# Patient Record
Sex: Male | Born: 1990
Health system: Southern US, Community
[De-identification: ages and names within clinical notes are randomized; demographics above are authoritative.]

## PROBLEM LIST (undated history)

## (undated) HISTORY — PX: OTHER SURGICAL HISTORY: SHX169

---

## 2020-04-22 ENCOUNTER — Inpatient Hospital Stay (HOSPITAL_COMMUNITY): Payer: Worker's Compensation

## 2020-04-22 ENCOUNTER — Inpatient Hospital Stay (HOSPITAL_COMMUNITY): Payer: Worker's Compensation | Admitting: Anesthesiology

## 2020-04-22 ENCOUNTER — Encounter (HOSPITAL_COMMUNITY)
Admission: EM | Disposition: A | Discharge status: Home / Self Care | Payer: Self-pay | Source: Home / Self Care | Attending: Orthopedic Surgery

## 2020-04-22 ENCOUNTER — Emergency Department (HOSPITAL_COMMUNITY): Payer: Worker's Compensation

## 2020-04-22 ENCOUNTER — Encounter (HOSPITAL_COMMUNITY): Payer: Self-pay

## 2020-04-22 ENCOUNTER — Inpatient Hospital Stay (HOSPITAL_COMMUNITY)
Admission: EM | Admit: 2020-04-22 | Discharge: 2020-04-29 | DRG: 493 | Disposition: A | Discharge status: Home / Self Care | Payer: Worker's Compensation | Attending: Orthopedic Surgery | Admitting: Orthopedic Surgery

## 2020-04-22 DIAGNOSIS — Z419 Encounter for procedure for purposes other than remedying health state, unspecified: Secondary | ICD-10-CM

## 2020-04-22 DIAGNOSIS — E559 Vitamin D deficiency, unspecified: Secondary | ICD-10-CM | POA: Diagnosis present

## 2020-04-22 DIAGNOSIS — Y990 Civilian activity done for income or pay: Secondary | ICD-10-CM

## 2020-04-22 DIAGNOSIS — S83412A Sprain of medial collateral ligament of left knee, initial encounter: Secondary | ICD-10-CM | POA: Diagnosis present

## 2020-04-22 DIAGNOSIS — R030 Elevated blood-pressure reading, without diagnosis of hypertension: Secondary | ICD-10-CM | POA: Diagnosis present

## 2020-04-22 DIAGNOSIS — R58 Hemorrhage, not elsewhere classified: Secondary | ICD-10-CM

## 2020-04-22 DIAGNOSIS — S83429A Sprain of lateral collateral ligament of unspecified knee, initial encounter: Secondary | ICD-10-CM | POA: Diagnosis present

## 2020-04-22 DIAGNOSIS — S82141A Displaced bicondylar fracture of right tibia, initial encounter for closed fracture: Secondary | ICD-10-CM | POA: Diagnosis present

## 2020-04-22 DIAGNOSIS — W132XXA Fall from, out of or through roof, initial encounter: Secondary | ICD-10-CM | POA: Diagnosis present

## 2020-04-22 DIAGNOSIS — S83282A Other tear of lateral meniscus, current injury, left knee, initial encounter: Secondary | ICD-10-CM | POA: Diagnosis present

## 2020-04-22 DIAGNOSIS — T148XXA Other injury of unspecified body region, initial encounter: Secondary | ICD-10-CM

## 2020-04-22 DIAGNOSIS — S82142A Displaced bicondylar fracture of left tibia, initial encounter for closed fracture: Secondary | ICD-10-CM | POA: Diagnosis present

## 2020-04-22 DIAGNOSIS — Z20822 Contact with and (suspected) exposure to covid-19: Secondary | ICD-10-CM | POA: Diagnosis present

## 2020-04-22 DIAGNOSIS — D62 Acute posthemorrhagic anemia: Secondary | ICD-10-CM | POA: Diagnosis not present

## 2020-04-22 DIAGNOSIS — S83512A Sprain of anterior cruciate ligament of left knee, initial encounter: Secondary | ICD-10-CM | POA: Diagnosis present

## 2020-04-22 DIAGNOSIS — W139XXA Fall from, out of or through building, not otherwise specified, initial encounter: Secondary | ICD-10-CM

## 2020-04-22 DIAGNOSIS — E8889 Other specified metabolic disorders: Secondary | ICD-10-CM | POA: Diagnosis present

## 2020-04-22 DIAGNOSIS — S8992XA Unspecified injury of left lower leg, initial encounter: Secondary | ICD-10-CM | POA: Diagnosis present

## 2020-04-22 HISTORY — PX: EXTERNAL FIXATION LEG: SHX1549

## 2020-04-22 LAB — RESP PANEL BY RT-PCR (FLU A&B, COVID) ARPGX2
Influenza A by PCR: NEGATIVE
Influenza B by PCR: NEGATIVE
SARS Coronavirus 2 by RT PCR: NEGATIVE

## 2020-04-22 SURGERY — EXTERNAL FIXATION, LOWER EXTREMITY
Anesthesia: General | Laterality: Right

## 2020-04-22 MED ORDER — ONDANSETRON HCL 4 MG/2ML IJ SOLN: 4.0000 mg | Freq: Four times a day (QID) | INTRAMUSCULAR | Status: DC | PRN

## 2020-04-22 MED ORDER — DEXAMETHASONE SODIUM PHOSPHATE 10 MG/ML IJ SOLN
INTRAMUSCULAR | Status: DC | PRN
  Administered 2020-04-22: 10 mg via INTRAVENOUS

## 2020-04-22 MED ORDER — OXYCODONE HCL 5 MG PO TABS
5.0000 mg | ORAL_TABLET | ORAL | Status: DC | PRN
Start: 2020-04-22 — End: 2020-04-29
  Administered 2020-04-22: 5 mg via ORAL
  Administered 2020-04-23 – 2020-04-25 (×6): 10 mg via ORAL
  Administered 2020-04-26: 5 mg via ORAL
  Administered 2020-04-26: 10 mg via ORAL
  Administered 2020-04-27: 5 mg via ORAL
  Administered 2020-04-27: 10 mg via ORAL
  Filled 2020-04-22 (×12): qty 2

## 2020-04-22 MED ORDER — GABAPENTIN 100 MG PO CAPS
100.0000 mg | ORAL_CAPSULE | Freq: Three times a day (TID) | ORAL | Status: DC
  Administered 2020-04-22 – 2020-04-25 (×10): 100 mg via ORAL
  Filled 2020-04-22 (×10): qty 1

## 2020-04-22 MED ORDER — ENOXAPARIN SODIUM 40 MG/0.4ML ~~LOC~~ SOLN
40.0000 mg | SUBCUTANEOUS | Status: DC
  Administered 2020-04-23 – 2020-04-25 (×3): 40 mg via SUBCUTANEOUS
  Filled 2020-04-22 (×3): qty 0.4

## 2020-04-22 MED ORDER — DEXTROSE 5 % IV SOLN
3.0000 g | INTRAVENOUS | Status: DC
  Filled 2020-04-22: qty 3000

## 2020-04-22 MED ORDER — METOCLOPRAMIDE HCL 5 MG PO TABS: 5.0000 mg | ORAL_TABLET | Freq: Three times a day (TID) | ORAL | Status: DC | PRN

## 2020-04-22 MED ORDER — FENTANYL CITRATE (PF) 100 MCG/2ML IJ SOLN
25.0000 ug | INTRAMUSCULAR | Status: DC | PRN
  Administered 2020-04-22: 50 ug via INTRAVENOUS

## 2020-04-22 MED ORDER — LIDOCAINE 2% (20 MG/ML) 5 ML SYRINGE
INTRAMUSCULAR | Status: DC | PRN
  Administered 2020-04-22: 60 mg via INTRAVENOUS

## 2020-04-22 MED ORDER — MORPHINE SULFATE (PF) 2 MG/ML IV SOLN: 2.0000 mg | INTRAVENOUS | Status: DC | PRN

## 2020-04-22 MED ORDER — POVIDONE-IODINE 10 % EX SWAB: 2.0000 "application " | Freq: Once | CUTANEOUS | Status: DC

## 2020-04-22 MED ORDER — LACTATED RINGERS IV SOLN: INTRAVENOUS | Status: DC | PRN

## 2020-04-22 MED ORDER — GLYCOPYRROLATE PF 0.2 MG/ML IJ SOSY
PREFILLED_SYRINGE | INTRAMUSCULAR | Status: DC | PRN
  Administered 2020-04-22: .2 mg via INTRAVENOUS

## 2020-04-22 MED ORDER — MIDAZOLAM HCL 5 MG/5ML IJ SOLN
INTRAMUSCULAR | Status: DC | PRN
  Administered 2020-04-22: 2 mg via INTRAVENOUS

## 2020-04-22 MED ORDER — IBUPROFEN 400 MG PO TABS
600.0000 mg | ORAL_TABLET | Freq: Once | ORAL | Status: AC
  Administered 2020-04-22: 600 mg via ORAL
  Filled 2020-04-22: qty 1

## 2020-04-22 MED ORDER — CHLORHEXIDINE GLUCONATE 4 % EX LIQD: 60.0000 mL | Freq: Once | CUTANEOUS | Status: DC

## 2020-04-22 MED ORDER — OXYCODONE HCL 5 MG PO TABS: 5.0000 mg | ORAL_TABLET | ORAL | Status: DC | PRN

## 2020-04-22 MED ORDER — POLYETHYLENE GLYCOL 3350 17 G PO PACK: 17.0000 g | PACK | Freq: Every day | ORAL | Status: DC | PRN

## 2020-04-22 MED ORDER — CHLORHEXIDINE GLUCONATE 4 % EX LIQD
60.0000 mL | Freq: Once | CUTANEOUS | Status: DC
  Filled 2020-04-22: qty 60

## 2020-04-22 MED ORDER — ONDANSETRON HCL 4 MG PO TABS: 4.0000 mg | ORAL_TABLET | Freq: Four times a day (QID) | ORAL | Status: DC | PRN

## 2020-04-22 MED ORDER — ONDANSETRON HCL 4 MG/2ML IJ SOLN
INTRAMUSCULAR | Status: DC | PRN
  Administered 2020-04-22: 4 mg via INTRAVENOUS

## 2020-04-22 MED ORDER — PROPOFOL 10 MG/ML IV BOLUS
INTRAVENOUS | Status: DC | PRN
  Administered 2020-04-22: 200 mg via INTRAVENOUS
  Administered 2020-04-22: 100 mg via INTRAVENOUS

## 2020-04-22 MED ORDER — DIPHENHYDRAMINE HCL 12.5 MG/5ML PO ELIX: 12.5000 mg | ORAL_SOLUTION | ORAL | Status: DC | PRN

## 2020-04-22 MED ORDER — METHOCARBAMOL 500 MG PO TABS: 500.0000 mg | ORAL_TABLET | Freq: Four times a day (QID) | ORAL | Status: DC | PRN

## 2020-04-22 MED ORDER — METHOCARBAMOL 1000 MG/10ML IJ SOLN
500.0000 mg | Freq: Four times a day (QID) | INTRAVENOUS | Status: DC | PRN
  Filled 2020-04-22: qty 5

## 2020-04-22 MED ORDER — DOCUSATE SODIUM 100 MG PO CAPS
100.0000 mg | ORAL_CAPSULE | Freq: Two times a day (BID) | ORAL | Status: DC
  Administered 2020-04-22 – 2020-04-29 (×13): 100 mg via ORAL
  Filled 2020-04-22 (×13): qty 1

## 2020-04-22 MED ORDER — DEXMEDETOMIDINE HCL 200 MCG/2ML IV SOLN
INTRAVENOUS | Status: DC | PRN
  Administered 2020-04-22: 12 ug via INTRAVENOUS

## 2020-04-22 MED ORDER — PANTOPRAZOLE SODIUM 40 MG PO TBEC
40.0000 mg | DELAYED_RELEASE_TABLET | Freq: Every day | ORAL | Status: DC
  Administered 2020-04-22 – 2020-04-29 (×7): 40 mg via ORAL
  Filled 2020-04-22 (×7): qty 1

## 2020-04-22 MED ORDER — ACETAMINOPHEN 500 MG PO TABS: 1000.0000 mg | ORAL_TABLET | Freq: Once | ORAL | Status: DC

## 2020-04-22 MED ORDER — MIDAZOLAM HCL 2 MG/2ML IJ SOLN
INTRAMUSCULAR | Status: AC
  Filled 2020-04-22: qty 2

## 2020-04-22 MED ORDER — CELECOXIB 200 MG PO CAPS
ORAL_CAPSULE | ORAL | Status: AC
  Filled 2020-04-22: qty 1

## 2020-04-22 MED ORDER — ENOXAPARIN SODIUM 40 MG/0.4ML ~~LOC~~ SOLN: 40.0000 mg | SUBCUTANEOUS | Status: DC

## 2020-04-22 MED ORDER — SODIUM CHLORIDE 0.9 % IV SOLN: INTRAVENOUS | Status: DC

## 2020-04-22 MED ORDER — CELECOXIB 200 MG PO CAPS: 200.0000 mg | ORAL_CAPSULE | Freq: Once | ORAL | Status: DC

## 2020-04-22 MED ORDER — PHENYLEPHRINE HCL (PRESSORS) 10 MG/ML IV SOLN
INTRAVENOUS | Status: DC | PRN
  Administered 2020-04-22: 100 ug via INTRAVENOUS

## 2020-04-22 MED ORDER — METOCLOPRAMIDE HCL 5 MG/ML IJ SOLN: 5.0000 mg | Freq: Three times a day (TID) | INTRAMUSCULAR | Status: DC | PRN

## 2020-04-22 MED ORDER — FENTANYL CITRATE (PF) 250 MCG/5ML IJ SOLN
INTRAMUSCULAR | Status: AC
  Filled 2020-04-22: qty 5

## 2020-04-22 MED ORDER — AMISULPRIDE (ANTIEMETIC) 5 MG/2ML IV SOLN: 10.0000 mg | Freq: Once | INTRAVENOUS | Status: DC | PRN

## 2020-04-22 MED ORDER — FENTANYL CITRATE (PF) 250 MCG/5ML IJ SOLN
INTRAMUSCULAR | Status: DC | PRN
  Administered 2020-04-22 (×3): 50 ug via INTRAVENOUS
  Administered 2020-04-22: 100 ug via INTRAVENOUS

## 2020-04-22 MED ORDER — DEXTROSE 5 % IV SOLN
3.0000 g | INTRAVENOUS | Status: AC
  Administered 2020-04-22: 3 g via INTRAVENOUS
  Filled 2020-04-22 (×3): qty 3000

## 2020-04-22 MED ORDER — ACETAMINOPHEN 325 MG PO TABS
325.0000 mg | ORAL_TABLET | Freq: Four times a day (QID) | ORAL | Status: DC | PRN
Start: 2020-04-23 — End: 2020-04-26
  Administered 2020-04-24: 650 mg via ORAL
  Filled 2020-04-22: qty 2

## 2020-04-22 MED ORDER — HYDROMORPHONE HCL 1 MG/ML IJ SOLN
0.5000 mg | INTRAMUSCULAR | Status: DC | PRN
  Administered 2020-04-25 – 2020-04-26 (×3): 1 mg via INTRAVENOUS
  Filled 2020-04-22 (×3): qty 1

## 2020-04-22 MED ORDER — SUCCINYLCHOLINE CHLORIDE 20 MG/ML IJ SOLN
INTRAMUSCULAR | Status: DC | PRN
  Administered 2020-04-22: 120 mg via INTRAVENOUS

## 2020-04-22 MED ORDER — METHOCARBAMOL 500 MG PO TABS
ORAL_TABLET | ORAL | Status: AC
  Administered 2020-04-22: 500 mg via ORAL
  Filled 2020-04-22: qty 1

## 2020-04-22 MED ORDER — ACETAMINOPHEN 500 MG PO TABS
ORAL_TABLET | ORAL | Status: AC
  Filled 2020-04-22: qty 2

## 2020-04-22 MED ORDER — OXYCODONE HCL 5 MG PO TABS
10.0000 mg | ORAL_TABLET | ORAL | Status: DC | PRN
  Administered 2020-04-23 – 2020-04-25 (×3): 10 mg via ORAL
  Administered 2020-04-25: 15 mg via ORAL
  Administered 2020-04-26: 10 mg via ORAL
  Administered 2020-04-27 – 2020-04-29 (×10): 15 mg via ORAL
  Filled 2020-04-22 (×6): qty 3
  Filled 2020-04-22: qty 2
  Filled 2020-04-22 (×3): qty 3
  Filled 2020-04-22: qty 2
  Filled 2020-04-22 (×2): qty 3
  Filled 2020-04-22: qty 2

## 2020-04-22 MED ORDER — ACETAMINOPHEN 10 MG/ML IV SOLN
INTRAVENOUS | Status: DC | PRN
  Administered 2020-04-22: 1000 mg via INTRAVENOUS

## 2020-04-22 MED ORDER — FENTANYL CITRATE (PF) 100 MCG/2ML IJ SOLN
INTRAMUSCULAR | Status: AC
  Administered 2020-04-22: 50 ug via INTRAVENOUS
  Filled 2020-04-22: qty 2

## 2020-04-22 MED ORDER — EPHEDRINE SULFATE 50 MG/ML IJ SOLN
INTRAMUSCULAR | Status: DC | PRN
  Administered 2020-04-22: 10 mg via INTRAVENOUS

## 2020-04-22 SURGICAL SUPPLY — 38 items
BAR GLASS FIBER EXFX 11X500 (EXFIX) ×4 IMPLANT
BNDG GAUZE ELAST 4 BULKY (GAUZE/BANDAGES/DRESSINGS) ×6 IMPLANT
COVER SURGICAL LIGHT HANDLE (MISCELLANEOUS) ×2 IMPLANT
COVER WAND RF STERILE (DRAPES) IMPLANT
DRAPE C-ARM 42X72 X-RAY (DRAPES) ×2 IMPLANT
DRAPE C-ARMOR (DRAPES) ×2 IMPLANT
DRAPE ORTHO SPLIT 77X108 STRL (DRAPES) ×2
DRAPE SURG ORHT 6 SPLT 77X108 (DRAPES) ×2 IMPLANT
DRAPE U-SHAPE 47X51 STRL (DRAPES) ×2 IMPLANT
DURAPREP 26ML APPLICATOR (WOUND CARE) ×4 IMPLANT
ELECT REM PT RETURN 9FT ADLT (ELECTROSURGICAL)
ELECTRODE REM PT RTRN 9FT ADLT (ELECTROSURGICAL) IMPLANT
GAUZE SPONGE 4X4 12PLY STRL (GAUZE/BANDAGES/DRESSINGS) ×2 IMPLANT
GAUZE XEROFORM 5X9 LF (GAUZE/BANDAGES/DRESSINGS) ×2 IMPLANT
GLOVE BIO SURGEON STRL SZ8 (GLOVE) ×2 IMPLANT
GLOVE ORTHO TXT STRL SZ7.5 (GLOVE) ×2 IMPLANT
GOWN STRL REUS W/ TWL LRG LVL3 (GOWN DISPOSABLE) ×1 IMPLANT
GOWN STRL REUS W/ TWL XL LVL3 (GOWN DISPOSABLE) ×4 IMPLANT
GOWN STRL REUS W/TWL LRG LVL3 (GOWN DISPOSABLE) ×1
GOWN STRL REUS W/TWL XL LVL3 (GOWN DISPOSABLE) ×4
KIT BASIN OR (CUSTOM PROCEDURE TRAY) ×2 IMPLANT
KIT TURNOVER KIT B (KITS) ×2 IMPLANT
MANIFOLD NEPTUNE II (INSTRUMENTS) ×2 IMPLANT
NS IRRIG 1000ML POUR BTL (IV SOLUTION) ×2 IMPLANT
PACK ORTHO EXTREMITY (CUSTOM PROCEDURE TRAY) ×2 IMPLANT
PAD ARMBOARD 7.5X6 YLW CONV (MISCELLANEOUS) ×4 IMPLANT
PADDING CAST COTTON 6X4 STRL (CAST SUPPLIES) ×2 IMPLANT
PIN CLAMP 2BAR 75MM BLUE (EXFIX) ×4 IMPLANT
PIN HALF ORANGE 5X200X45MM (EXFIX) ×4 IMPLANT
PIN HALF YELLOW 5X160X35 (EXFIX) ×4 IMPLANT
SPONGE LAP 18X18 RF (DISPOSABLE) ×2 IMPLANT
SUT ETHILON 2 0 FS 18 (SUTURE) IMPLANT
SUT VIC AB 2-0 CT1 27 (SUTURE)
SUT VIC AB 2-0 CT1 TAPERPNT 27 (SUTURE) IMPLANT
TOWEL GREEN STERILE (TOWEL DISPOSABLE) ×2 IMPLANT
TOWEL GREEN STERILE FF (TOWEL DISPOSABLE) ×2 IMPLANT
UNDERPAD 30X36 HEAVY ABSORB (UNDERPADS AND DIAPERS) ×2 IMPLANT
WATER STERILE IRR 1000ML POUR (IV SOLUTION) ×2 IMPLANT

## 2020-04-22 NOTE — H&P (Signed)
See consultation note from today for this patient by Earney Hamburg, PA-C.  This can serve as the history and physical exam for this patient.  I have seen and examined the patient as well.  He has bilateral tibial plateau fractures.  The left knee is a lateral minimally displaced fracture.  The right knee has a posterior medial comminuted fracture of the tibial plateau.  It does need temporizing with external fixation spanning the right knee in order to decrease pressure on the soft tissues and provide some ligamentotaxis.  He will require definitive fixation of that right knee next week by the Ortho trauma specialists (Dr. Johnney Killian. Jena Gauss).  The patient has no other acute medical issues.  He is comfortable and denies any significant pain at his right knee or left knee or numbness and tingling in his feet.  The compartments are soft and there is no evidence of compartment syndrome.  Our plan is to proceed to surgery this evening for external fixation of the right lower extremity spanning the knee joint.  Informed consent has been obtained and the right knee has been marked.

## 2020-04-22 NOTE — ED Notes (Signed)
Attempted to call report x1, RN unable to take report at this time.

## 2020-04-22 NOTE — ED Triage Notes (Signed)
Pt arrived by [facility] from [facility] site. Pt was on a 10-12 foot ladder and fell. States he landed on his feet, did not hit his head or have an LOC.   C/o right knee pain, at rest 3/10 increased pain with movement and unable to bear weight  No open wounds, knee appears swollen and bruised

## 2020-04-22 NOTE — ED Provider Notes (Signed)
[facility] EMERGENCY DEPARTMENT Provider Note   CSN: [id number] Arrival date & time: 04/22/20  1119     History Chief Complaint  Patient presents with  . Fall    Jacob Daniels is a 30 y.o. male.   Fall The current episode started less than 1 hour ago. The problem has not changed since onset.Pertinent negatives include no chest pain, no abdominal pain, no headaches and no shortness of breath. Associated symptoms comments: Right knee pain . Exacerbated by: ambulation. Relieved by: rest. He has tried nothing for the symptoms. The treatment provided no relief.       History reviewed. No pertinent past medical history.  Patient Active Problem List   Diagnosis Date Noted  . Tibial plateau fracture, right 04/22/2020  . Closed fracture of right tibial plateau, initial encounter 04/22/2020         Family History  Family history unknown: Yes    Social History   Tobacco Use  . Smoking status: Never Smoker  . Smokeless tobacco: Never Used  Vaping Use  . Vaping Use: Never used    Home Medications Prior to Admission medications   Medication Sig Start Date End Date Taking? Authorizing Provider  aspirin-acetaminophen-caffeine (EXCEDRIN MIGRAINE) [phone] MG tablet Take 2 tablets by mouth daily as needed for headache.   Yes [provider]  Magnesium 250 MG TABS Take 250 mg by mouth at bedtime as needed (sleep/stomach pain).   Yes [provider]  Multiple Vitamin (MULTIVITAMIN WITH MINERALS) TABS tablet Take 1 tablet by mouth daily.   Yes [provider]  naproxen sodium (ALEVE) 220 MG tablet Take 440-660 mg by mouth 3 (three) times daily as needed (pain).   Yes [provider]  Potassium 99 MG TABS Take 99 mg by mouth daily.   Yes [provider]    Allergies    Patient has no known allergies.  Review of Systems   Review of Systems  Constitutional: Negative for chills and fever.  HENT: Negative for  congestion and rhinorrhea.   Respiratory: Negative for cough and shortness of breath.   Cardiovascular: Negative for chest pain and palpitations.  Gastrointestinal: Negative for abdominal pain, diarrhea, nausea and vomiting.  Genitourinary: Negative for difficulty urinating and dysuria.  Musculoskeletal: Positive for arthralgias. Negative for back pain.  Skin: Negative for color change and rash.  Neurological: Negative for light-headedness and headaches.    Physical Exam Updated Vital Signs BP (!) 150/84 (BP Location: Right Arm)   Pulse 76   Temp 97.9 F (36.6 C) (Oral)   Resp 17   Ht 6\' 2"  (1.88 m)   Wt 127 kg   SpO2 97%   BMI 35.95 kg/m   Physical Exam Vitals and nursing note reviewed. Exam conducted with a chaperone present.  Constitutional:      Appearance: He is well-developed and well-nourished.  HENT:     Head: Normocephalic and atraumatic.  Eyes:     Conjunctiva/sclera: Conjunctivae normal.  Cardiovascular:     Rate and Rhythm: Normal rate and regular rhythm.     Heart sounds: No murmur heard.   Pulmonary:     Effort: Pulmonary effort is normal. No respiratory distress.     Breath sounds: Normal breath sounds.  Abdominal:     Palpations: Abdomen is soft.     Tenderness: There is no abdominal tenderness.  Musculoskeletal:        General: No edema.     Cervical back: Normal range of motion  and neck supple. No tenderness.     Comments: Decreased range of motion of the right knee due to pain.  Tenderness palpation throughout the medial lateral joint line.  Neurovascular intact distal.  No open wounds.  No focal bony tenderness of the ankle or tibia or femur.  No significant tenderness or decreased range of motion of the left leg neurovascular intact.  No spinal tenderness no sacral tenderness no coccygeal tenderness  Skin:    General: Skin is warm and dry.  Neurological:     Mental Status: He is alert.     Comments: Sensation and motor function intact in both  lower extremities.  Psychiatric:        Mood and Affect: Mood and affect normal.     ED Results / Procedures / Treatments   Labs (all labs ordered are listed, but only abnormal results are displayed) Labs Reviewed  CBC - Abnormal; Notable for the following components:      Result Value   WBC 13.8 (*)    All other components within normal limits  RESP PANEL BY RT-PCR (FLU A&B, COVID) ARPGX2  SURGICAL PCR SCREEN  HIV ANTIBODY (ROUTINE TESTING W REFLEX)  CREATININE, SERUM    EKG None  Radiology CT KNEE RIGHT WO CONTRAST  Result Date: 04/23/2020 CLINICAL DATA:  Right tibial plateau fracture status post external fixation. EXAM: CT OF THE RIGHT KNEE WITHOUT CONTRAST 3-DIMENSIONAL CT IMAGE RENDERING ON ACQUISITION WORKSTATION TECHNIQUE: Multidetector CT imaging of the right knee was performed according to the standard protocol. Multiplanar CT image reconstructions were also generated. 3-dimensional CT images were rendered by post-processing of the original CT data on an acquisition workstation. The 3-dimensional CT images were interpreted and findings were reported in the accompanying complete CT report for this study. COMPARISON:  Radiographs and CT 04/22/2020. FINDINGS: Bones/Joint/Cartilage Interval external fixation. The external fixators are not imaged. There is been no significant change in the alignment of the comminuted fracture of the medial tibial plateau posteriorly. The major fracture fragment posteriorly remains posteriorly displaced up to 4.5 cm and demonstrates anterior angulation. The articular surface of the medial tibial plateau is depressed by 1 cm. There is central extension of the fracture with significant comminution of the tibial spine. Fracture extends to involve the posterior aspect of the lateral tibial plateau which remains mildly displaced. The proximal fibula, distal femur and patella are intact. Moderate sized lipohemarthrosis. Ligaments Suboptimally assessed by CT.  The PCL inserts on the dominant posterior fracture fragment of the proximal tibia. Muscles and Tendons Unremarkable.  The extensor mechanism is intact. Soft tissues Increased soft tissue swelling with subcutaneous edema anteriorly and medially around the knee. No focal fluid collection, foreign body or soft tissue emphysema. IMPRESSION: 1. No significant change in alignment of the comminuted intra-articular fracture of the proximal tibia. The medial tibial plateau remains disrupted and significantly displaced posteriorly. 2. Moderate sized lipohemarthrosis. 3. Increased soft tissue swelling with subcutaneous edema anteriorly and medially around the knee. Electronically Signed   By: Carey Bullocks M.D.   On: 04/23/2020 18:01   CT 3D RECON AT SCANNER  Result Date: 04/23/2020 CLINICAL DATA:  Right tibial plateau fracture status post external fixation. EXAM: CT OF THE RIGHT KNEE WITHOUT CONTRAST 3-DIMENSIONAL CT IMAGE RENDERING ON ACQUISITION WORKSTATION TECHNIQUE: Multidetector CT imaging of the right knee was performed according to the standard protocol. Multiplanar CT image reconstructions were also generated. 3-dimensional CT images were rendered by post-processing of the original CT data on an acquisition workstation.  The 3-dimensional CT images were interpreted and findings were reported in the accompanying complete CT report for this study. COMPARISON:  Radiographs and CT 04/22/2020. FINDINGS: Bones/Joint/Cartilage Interval external fixation. The external fixators are not imaged. There is been no significant change in the alignment of the comminuted fracture of the medial tibial plateau posteriorly. The major fracture fragment posteriorly remains posteriorly displaced up to 4.5 cm and demonstrates anterior angulation. The articular surface of the medial tibial plateau is depressed by 1 cm. There is central extension of the fracture with significant comminution of the tibial spine. Fracture extends to involve  the posterior aspect of the lateral tibial plateau which remains mildly displaced. The proximal fibula, distal femur and patella are intact. Moderate sized lipohemarthrosis. Ligaments Suboptimally assessed by CT. The PCL inserts on the dominant posterior fracture fragment of the proximal tibia. Muscles and Tendons Unremarkable.  The extensor mechanism is intact. Soft tissues Increased soft tissue swelling with subcutaneous edema anteriorly and medially around the knee. No focal fluid collection, foreign body or soft tissue emphysema. IMPRESSION: 1. No significant change in alignment of the comminuted intra-articular fracture of the proximal tibia. The medial tibial plateau remains disrupted and significantly displaced posteriorly. 2. Moderate sized lipohemarthrosis. 3. Increased soft tissue swelling with subcutaneous edema anteriorly and medially around the knee. Electronically Signed   By: Carey Bullocks M.D.   On: 04/23/2020 18:01    Procedures Procedures   Medications Ordered in ED Medications  chlorhexidine (HIBICLENS) 4 % liquid 4 application (4 application Topical Not Given 04/22/20 2124)  povidone-iodine 10 % swab 2 application (2 application Topical Not Given 04/22/20 2123)  morphine 2 MG/ML injection 2 mg (has no administration in time range)  acetaminophen (TYLENOL) 500 MG tablet (has no administration in time range)  celecoxib (CELEBREX) 200 MG capsule (has no administration in time range)  0.9 %  sodium chloride infusion ( Intravenous Infusion Verify 04/24/20 2159)  methocarbamol (ROBAXIN) tablet 500 mg (500 mg Oral Given 04/22/20 1837)    Or  methocarbamol (ROBAXIN) 500 mg in dextrose 5 % 50 mL IVPB ( Intravenous See Alternative 04/22/20 1837)  diphenhydrAMINE (BENADRYL) 12.5 MG/5ML elixir 12.5-25 mg (has no administration in time range)  docusate sodium (COLACE) capsule 100 mg (100 mg Oral Given 04/25/20 1053)  ondansetron (ZOFRAN) tablet 4 mg (has no administration in time range)    Or   ondansetron (ZOFRAN) injection 4 mg (has no administration in time range)  metoCLOPramide (REGLAN) tablet 5-10 mg (has no administration in time range)    Or  metoCLOPramide (REGLAN) injection 5-10 mg (has no administration in time range)  enoxaparin (LOVENOX) injection 40 mg (40 mg Subcutaneous Given 04/25/20 1053)  acetaminophen (TYLENOL) tablet 325-650 mg (650 mg Oral Given 04/24/20 1135)  oxyCODONE (Oxy IR/ROXICODONE) immediate release tablet 5-10 mg (10 mg Oral Given 04/25/20 1053)  oxyCODONE (Oxy IR/ROXICODONE) immediate release tablet 10-15 mg (10 mg Oral Given 04/25/20 0531)  HYDROmorphone (DILAUDID) injection 0.5-1 mg (1 mg Intravenous Given 04/25/20 1343)  gabapentin (NEURONTIN) capsule 100 mg (100 mg Oral Given 04/25/20 1053)  polyethylene glycol (MIRALAX / GLYCOLAX) packet 17 g (has no administration in time range)  pantoprazole (PROTONIX) EC tablet 40 mg (40 mg Oral Given 04/25/20 1053)  ibuprofen (ADVIL) tablet 600 mg (600 mg Oral Given 04/22/20 1137)  ceFAZolin (ANCEF) 3 g in dextrose 5 % 50 mL IVPB (3 g Intravenous Not Given 04/23/20 1610)    ED Course  I have reviewed the triage vital signs and the nursing  notes.  Pertinent labs & imaging results that were available during my care of the patient were reviewed by me and considered in my medical decision making (see chart for details).    MDM Rules/Calculators/A&P                          Fall from approximately 10 feet landed on bilateral knees.  Has difficulty ambulating with severe pain with right knee.  No other wounds or injuries found reported.  Cleared cervical spine with Nexus and Canadian criteria.  Normal mental status no neurologic deficit.  Vital signs stable pain control given by oral pain medication will get plain film imaging of the knees.  Patient x-rays reviewed by myself show a proximal tibia fracture with displacement posteriorly and medially.  He remains neurovascular intact with soft compartments.  I consulted  orthopedics for assessment.  I have offered him a pain control he needs no further pain control.  X-ray review of the other extremity also shows a tibial plateau fracture.  Patient has concerns for fracture of both proximal tibia.  He will need further assessment from orthopedics.  He remains neurovascular intact with good pain control.  The patient will be admitted to the ortho team.  For the remainder this patient's care please see inpatient team notes.  I will intervene as needed while the patient remains in the emergency department.      Final Clinical Impression(s) / ED Diagnoses Final diagnoses:  Surgery, elective  Fracture    Rx / DC Orders ED Discharge Orders    None       Sabino DonovanKatz, Jacaden Forbush C, MD 04/25/20 1437

## 2020-04-22 NOTE — Anesthesia Preprocedure Evaluation (Signed)
Anesthesia Evaluation  Patient identified by MRN, date of birth, ID band Patient awake    Reviewed: Allergy & Precautions, NPO status , Patient's Chart, lab work & pertinent test results  Airway Mallampati: II  TM Distance: >3 FB Neck ROM: Full    Dental  (+) Dental Advisory Given   Pulmonary neg pulmonary ROS,    breath sounds clear to auscultation       Cardiovascular negative cardio ROS   Rhythm:Regular Rate:Normal     Neuro/Psych negative neurological ROS     GI/Hepatic negative GI ROS, Neg liver ROS,   Endo/Other  negative endocrine ROS  Renal/GU negative Renal ROS     Musculoskeletal Bilateral tibial plateau fx's   Abdominal   Peds  Hematology negative hematology ROS (+)   Anesthesia Other Findings   Reproductive/Obstetrics                             Anesthesia Physical Anesthesia Plan  ASA: I and emergent  Anesthesia Plan: General   Post-op Pain Management:    Induction: Intravenous  PONV Risk Score and Plan: 2 and Dexamethasone, Ondansetron and Treatment may vary due to age or medical condition  Airway Management Planned: Oral ETT  Additional Equipment: None  Intra-op Plan:   Post-operative Plan: Extubation in OR  Informed Consent: I have reviewed the patients History and Physical, chart, labs and discussed the procedure including the risks, benefits and alternatives for the proposed anesthesia with the patient or authorized representative who has indicated his/her understanding and acceptance.     Dental advisory given  Plan Discussed with: CRNA  Anesthesia Plan Comments:         Anesthesia Quick Evaluation

## 2020-04-22 NOTE — Anesthesia Procedure Notes (Signed)
Procedure Name: LMA Insertion Performed by: Vasiliki Smaldone, CRNA Pre-anesthesia Checklist: Patient identified, Emergency Drugs available, Suction available and Patient being monitored Patient Re-evaluated:Patient Re-evaluated prior to induction Oxygen Delivery Method: Circle system utilized Preoxygenation: Pre-oxygenation with 100% oxygen Induction Type: IV induction LMA: LMA inserted LMA Size: 5.0 Number of attempts: 1 Placement Confirmation: positive ETCO2 and breath sounds checked- equal and bilateral Tube secured with: Tape Dental Injury: Teeth and Oropharynx as per pre-operative assessment        

## 2020-04-22 NOTE — Transfer of Care (Signed)
Immediate Anesthesia Transfer of Care Note  Patient: Jacob Daniels  Procedure(s) Performed: EXTERNAL FIXATION LEG (Right )  Patient Location: PACU  Anesthesia Type:General  Level of Consciousness: awake, alert , oriented and patient cooperative  Airway & Oxygen Therapy: Patient Spontanous Breathing and Patient connected to face mask oxygen  Post-op Assessment: Report given to RN and Post -op Vital signs reviewed and stable  Post vital signs: Reviewed and stable  Last Vitals:  Vitals Value Taken Time  BP 153/84 04/22/20 1818  Temp    Pulse 95 04/22/20 1818  Resp 16 04/22/20 1818  SpO2 97 % 04/22/20 1818  Vitals shown include unvalidated device data.  Last Pain:  Vitals:   04/22/20 1140  TempSrc: Oral  PainSc: 3          Complications: No complications documented.

## 2020-04-22 NOTE — Anesthesia Postprocedure Evaluation (Signed)
Anesthesia Post Note  Patient: Hamilton Capri  Procedure(s) Performed: EXTERNAL FIXATION LEG (Right )     Patient location during evaluation: PACU Anesthesia Type: General Level of consciousness: awake Pain management: pain level controlled Vital Signs Assessment: post-procedure vital signs reviewed and stable Respiratory status: spontaneous breathing, nonlabored ventilation, respiratory function stable and patient connected to nasal cannula oxygen Cardiovascular status: blood pressure returned to baseline and stable Postop Assessment: no apparent nausea or vomiting Anesthetic complications: no   No complications documented.  Last Vitals:  Vitals:   04/22/20 1925 04/22/20 2006  BP: (!) 144/81 (!) 158/86  Pulse:  89  Resp:  18  Temp: 36.9 C 36.7 C  SpO2:  97%    Last Pain:  Vitals:   04/22/20 2006  TempSrc: Oral  PainSc:                  Catheryn Bacon Ellender

## 2020-04-22 NOTE — Anesthesia Procedure Notes (Signed)
Procedure Name: Intubation Performed by: Ponciano Ort, CRNA Pre-anesthesia Checklist: Patient identified, Emergency Drugs available, Suction available and Patient being monitored Patient Re-evaluated:Patient Re-evaluated prior to induction Oxygen Delivery Method: Circle system utilized Preoxygenation: Pre-oxygenation with 100% oxygen Induction Type: IV induction Ventilation: Mask ventilation without difficulty Laryngoscope Size: Glidescope and 4 Grade View: Grade I Tube type: Oral Tube size: 7.5 mm Number of attempts: 1 Airway Equipment and Method: Stylet and Oral airway Placement Confirmation: ETT inserted through vocal cords under direct vision,  positive ETCO2 and breath sounds checked- equal and bilateral Secured at: 23 cm Tube secured with: Tape Dental Injury: Teeth and Oropharynx as per pre-operative assessment  Comments: LMA didn't seal well; plan changed to ETT

## 2020-04-22 NOTE — Consult Note (Addendum)
Reason for Consult:Tibia plateau fx Referring Physician: Acey Lav Time called: 1212 Time at bedside: 1240   Jacob Daniels is an 30 y.o. male.  HPI: Jacob Daniels was up on a ladder about 12-15 feet when it came out from under him and he fell. He had immediate bilateral knee pain, especially the right, and could not get up or bear weight. He was brought to the ED where x-rays showed right and probably left tibia plateau fxs and orthopedic surgery was consulted. 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  History reviewed. No pertinent past medical history.  Past Surgical History:  Procedure Laterality Date  . Lobectomy      History reviewed. No pertinent family history.  Social History:  has no history on file for tobacco use, alcohol use, and drug use.  Allergies: No Known Allergies  Medications: I have reviewed the patient's current medications.  No results found for this or any previous visit (from the past 48 hour(s)).  DG Knee Complete 4 Views Left  Result Date: 04/22/2020 CLINICAL DATA:  Larey Seat 15 feet from a ladder, coming down on feet, LEFT knee tightness/pain, greater LEFT knee pain EXAM: LEFT KNEE - COMPLETE 4+ VIEW COMPARISON:  None FINDINGS: Osseous mineralization normal. Joint spaces preserved. Minimally depressed fracture at the posterior margin of the LEFT lateral tibial plateau with associated small joint effusion. No additional fracture, dislocation, or bone destruction. IMPRESSION: Minimally depressed fracture at the posterior margin of the LEFT lateral tibial plateau. Small associated joint effusion. Electronically Signed   By: Ulyses Southward M.D.   On: 04/22/2020 12:29   DG Knee Complete 4 Views Right  Result Date: 04/22/2020 CLINICAL DATA:  Fall today, fell 15 feet from a ladder, RIGHT knee pain EXAM: RIGHT KNEE - COMPLETE 4+ VIEW COMPARISON:  None FINDINGS: Osseous mineralization normal. Comminuted fracture of the medial tibial plateau extending into the tibial  spines. Posterior displacement of the dominant posterior fracture fragment. Femur and patella appear intact. Posterior subluxation of the RIGHT lateral femoral condyle at the lateral compartment without frank dislocation. Small associated joint effusion. IMPRESSION: Comminuted fracture of the medial tibial plateau extending into the tibial spines with posterior displacement of the dominant posterior tibial fragment. Posterior subluxation of RIGHT lateral femoral condyle at the lateral compartment without frank dislocation. Electronically Signed   By: Ulyses Southward M.D.   On: 04/22/2020 12:27    Review of Systems  HENT: Negative for ear discharge, ear pain, hearing loss and tinnitus.   Eyes: Negative for photophobia and pain.  Respiratory: Negative for cough and shortness of breath.   Cardiovascular: Negative for chest pain.  Gastrointestinal: Negative for abdominal pain, nausea and vomiting.  Genitourinary: Negative for dysuria, flank pain, frequency and urgency.  Musculoskeletal: Positive for arthralgias (Bilateral knees, R>>L). Negative for back pain, myalgias and neck pain.  Neurological: Negative for dizziness and headaches.  Hematological: Does not bruise/bleed easily.  Psychiatric/Behavioral: The patient is not nervous/anxious.    Blood pressure (!) 151/89, pulse 83, temperature 98.5 F (36.9 C), temperature source Oral, resp. rate 18, SpO2 99 %. Physical Exam Constitutional:      General: He is not in acute distress.    Appearance: He is well-developed and well-nourished. He is not diaphoretic.  HENT:     Head: Normocephalic and atraumatic.  Eyes:     General: No scleral icterus.       Right eye: No discharge.        Left eye:  No discharge.     Conjunctiva/sclera: Conjunctivae normal.  Cardiovascular:     Rate and Rhythm: Normal rate and regular rhythm.  Pulmonary:     Effort: Pulmonary effort is normal. No respiratory distress.  Musculoskeletal:     Cervical back: Normal range  of motion.     Comments: RLE No traumatic wounds, ecchymosis, or rash  Mod TTP knee, esp post  Mild knee effusion, no ankle effusion  Sens DPN, SPN, TN intact  Motor EHL, ext, flex, evers 5/5  DP 2+, PT 2+, No significant edema  LLE No traumatic wounds, ecchymosis, or rash  Mild TTP knee  No knee or ankle effusion  Sens DPN, SPN, TN intact  Motor EHL, ext, flex, evers 5/5  DP 2+, PT 2+, No significant edema  Skin:    General: Skin is warm and dry.  Neurological:     Mental Status: He is alert.  Psychiatric:        Mood and Affect: Mood and affect normal.        Behavior: Behavior normal.     Assessment/Plan: Tibia plateau fxs -- Plan ex fix right knee tonight with Dr. Magnus Ivan then ORIF Monday with Dr. Carola Frost or Haddix. NWB BLE.    Freeman Caldron, PA-C Orthopedic Surgery [phone] 04/22/2020, 12:48 PM

## 2020-04-22 NOTE — Brief Op Note (Signed)
04/22/2020  6:03 PM  PATIENT:  Jacob Daniels  30 y.o. male  PRE-OPERATIVE DIAGNOSIS:  Right tibia plateau fx  POST-OPERATIVE DIAGNOSIS:  Right tibia plateau fx  PROCEDURE:  Procedure(s): EXTERNAL FIXATION LEG (Right)  SURGEON:  Surgeon(s) and Role:    Kathryne Hitch, MD - Primary  PHYSICIAN ASSISTANT:  Rexene Edison, PA-C  ANESTHESIA:   general  COUNTS:  YES  TOURNIQUET:  * Missing tourniquet times found for documented tourniquets in log: [id number] *  DICTATION: .Other Dictation: Dictation Number [id number]  PLAN OF CARE: Admit to inpatient   PATIENT DISPOSITION:  PACU - hemodynamically stable.   Delay start of Pharmacological VTE agent (>24hrs) due to surgical blood loss or risk of bleeding: no

## 2020-04-23 ENCOUNTER — Inpatient Hospital Stay (HOSPITAL_COMMUNITY): Payer: Worker's Compensation

## 2020-04-23 ENCOUNTER — Other Ambulatory Visit (HOSPITAL_COMMUNITY): Payer: Self-pay

## 2020-04-23 ENCOUNTER — Encounter (HOSPITAL_COMMUNITY): Payer: Self-pay | Admitting: Orthopaedic Surgery

## 2020-04-23 LAB — CBC
HCT: 40.1 % (ref 39.0–52.0)
Hemoglobin: 13.4 g/dL (ref 13.0–17.0)
MCH: 28.4 pg (ref 26.0–34.0)
MCHC: 33.4 g/dL (ref 30.0–36.0)
MCV: 85 fL (ref 80.0–100.0)
Platelets: 320 10*3/uL (ref 150–400)
RBC: 4.72 MIL/uL (ref 4.22–5.81)
RDW: 13.2 % (ref 11.5–15.5)
WBC: 13.8 10*3/uL — ABNORMAL HIGH (ref 4.0–10.5)
nRBC: 0 % (ref 0.0–0.2)

## 2020-04-23 LAB — CREATININE, SERUM
Creatinine, Ser: 0.85 mg/dL (ref 0.61–1.24)
GFR, Estimated: >60 mL/min (ref >60)

## 2020-04-23 LAB — HIV ANTIBODY (ROUTINE TESTING W REFLEX): HIV Screen 4th Generation wRfx: NONREACTIVE

## 2020-04-23 NOTE — Plan of Care (Signed)

## 2020-04-23 NOTE — Plan of Care (Signed)
  Problem: Education: Goal: Knowledge of General Education information will improve Description Including pain rating scale, medication(s)/side effects and non-pharmacologic comfort measures Outcome: Progressing   Problem: Nutrition: Goal: Adequate nutrition will be maintained Outcome: Progressing   Problem: Pain Managment: Goal: General experience of comfort will improve Outcome: Progressing   

## 2020-04-23 NOTE — Consult Note (Addendum)
Orthopaedic Trauma Service Consultation  Reason for Consult: Bilateral tibial plateau fractures, right posterior shear pattern s/p ex fix Referring Physician: Allie Bossier, MD  Jacob Daniels is an 30 y.o. male.  HPI: Patient fell greater than 15 ft with bitateral knee pain. Trauma work up otherwise negative. Dr. Magnus Ivan placed spanning external fixator on the right. Given the pattern and complexity of the right plateau fracture, Dr. Magnus Ivan asserted this was outside his scope of practice and that it would be in the best interest of the patient to have these injuries evaluated and treated by a fellowship trained orthopaedic traumatologist. Consequently, I was consulted to provide definitive evaluation and management.  Despite the left posterolateral tibial plateau fracture, [name] reports no pain in the left knee while resting in bed or even with active motion until knee flexion greater than 90 degrees. The right remains rather sore. Denies other injuries. Denies LOC, Denies paresthesias.   History reviewed. No pertinent past medical history.  Past Surgical History:  Procedure Laterality Date  . Lobectomy      History reviewed. No pertinent family history.  Social History:  has no history on file for tobacco use, alcohol use, and drug use.  Allergies: No Known Allergies  Medications:  Prior to Admission:  Medications Prior to Admission  Medication Sig Dispense Refill Last Dose  . aspirin-acetaminophen-caffeine (EXCEDRIN MIGRAINE) 250-250-65 MG tablet Take 2 tablets by mouth daily as needed for headache.   2 weeks ago  . Magnesium 250 MG TABS Take 250 mg by mouth at bedtime as needed (sleep/stomach pain).   week ago  . Multiple Vitamin (MULTIVITAMIN WITH MINERALS) TABS tablet Take 1 tablet by mouth daily.   04/22/2020 at am  . naproxen sodium (ALEVE) 220 MG tablet Take 440-660 mg by mouth 3 (three) times daily as needed (pain).   Past Week at Unknown time  . Potassium 99 MG TABS Take 99  mg by mouth daily.   04/22/2020 at am    Results for orders placed or performed during the hospital encounter of 04/22/20 (from the past 48 hour(s))  Resp Panel by RT-PCR (Flu A&B, Covid) Nasopharyngeal Swab     Status: None   Collection Time: 04/22/20 12:06 PM   Specimen: Nasopharyngeal Swab; Nasopharyngeal(NP) swabs in vial transport medium  Result Value Ref Range   SARS Coronavirus 2 by RT PCR NEGATIVE NEGATIVE    Comment: (NOTE) SARS-CoV-2 target nucleic acids are NOT DETECTED.  The SARS-CoV-2 RNA is generally detectable in upper respiratory specimens during the acute phase of infection. The lowest concentration of SARS-CoV-2 viral copies this assay can detect is 138 copies/mL. A negative result does not preclude SARS-Cov-2 infection and should not be used as the sole basis for treatment or other patient management decisions. A negative result may occur with  improper specimen collection/handling, submission of specimen other than nasopharyngeal swab, presence of viral mutation(s) within the areas targeted by this assay, and inadequate number of viral copies(<138 copies/mL). A negative result must be combined with clinical observations, patient history, and epidemiological information. The expected result is Negative.  Fact Sheet for Patients:  BloggerCourse.com  Fact Sheet for Healthcare Providers:  SeriousBroker.it  This test is no t yet approved or cleared by the [place] FDA and  has been authorized for detection and/or diagnosis of SARS-CoV-2 by FDA under an Emergency Use Authorization (EUA). This EUA will remain  in effect (meaning this test can be used) for the duration of the COVID-19 declaration under Section 564(b)(1) of  the Act, 21 U.S.C.section 360bbb-3(b)(1), unless the authorization is terminated  or revoked sooner.       Influenza A by PCR NEGATIVE NEGATIVE   Influenza B by PCR NEGATIVE NEGATIVE     Comment: (NOTE) The Xpert Xpress SARS-CoV-2/FLU/RSV plus assay is intended as an aid in the diagnosis of influenza from Nasopharyngeal swab specimens and should not be used as a sole basis for treatment. Nasal washings and aspirates are unacceptable for Xpert Xpress SARS-CoV-2/FLU/RSV testing.  Fact Sheet for Patients: BloggerCourse.comhttps://www.fda.gov/media/152166/download  Fact Sheet for Healthcare Providers: SeriousBroker.ithttps://www.fda.gov/media/152162/download  This test is not yet approved or cleared by the [place] FDA and has been authorized for detection and/or diagnosis of SARS-CoV-2 by FDA under an Emergency Use Authorization (EUA). This EUA will remain in effect (meaning this test can be used) for the duration of the COVID-19 declaration under Section 564(b)(1) of the Act, 21 U.S.C. section 360bbb-3(b)(1), unless the authorization is terminated or revoked.  Performed at [facility], [address] [place], [place] [address]   HIV Antibody (routine testing w rflx)     Status: None   Collection Time: 04/23/20  1:24 AM  Result Value Ref Range   HIV Screen 4th Generation wRfx Non Reactive Non Reactive    Comment: Performed at [facility], [address] [place], [place] [address]  CBC     Status: Abnormal   Collection Time: 04/23/20  1:24 AM  Result Value Ref Range   WBC 13.8 (H) 4.0 - 10.5 K/uL   RBC 4.72 4.22 - 5.81 MIL/uL   Hemoglobin 13.4 13.0 - 17.0 g/dL   HCT 62.140.1 30.839.0 - 65.752.0 %   MCV 85.0 80.0 - 100.0 fL   MCH 28.4 26.0 - 34.0 pg   MCHC 33.4 30.0 - 36.0 g/dL   RDW 84.613.2 96.211.5 - 95.215.5 %   Platelets 320 150 - 400 K/uL   nRBC 0.0 0.0 - 0.2 %    Comment: Performed at [facility], [address] [place], [place] [address]  Creatinine, serum     Status: None   Collection Time: 04/23/20  1:24 AM  Result Value Ref Range   Creatinine, Ser 0.85 0.61 - 1.24 mg/dL   GFR, Estimated >44>60 >01>60 mL/min    Comment: (NOTE) Calculated using the CKD-EPI Creatinine Equation  (2021) Performed at [facility], [address] [place], [place] [address]     DG Knee 1-2 Views Right  Result Date: 04/22/2020 CLINICAL DATA:  External fixation of the right leg. EXAM: RIGHT KNEE - 1-2 VIEW; DG C-ARM 1-60 MIN COMPARISON:  April 22, 2020. FINDINGS: Fluoro time: 26 seconds. Eight C-arm fluoroscopic images were obtained intraoperatively and submitted for post operative interpretation. These images demonstrate external fixation of the right leg for treatment of an acute comminuted bicondylar tibial plateau fracture. Please see the performing provider's procedural report for further detail. IMPRESSION: Intraoperative fluoroscopy, as detailed above. Electronically Signed   By: Feliberto HartsFrederick S Jones MD   On: 04/22/2020 18:20   CT KNEE LEFT WO CONTRAST  Result Date: 04/22/2020 CLINICAL DATA:  Status post 15 foot fall from a ladder today. Left knee pain. Initial encounter. EXAM: CT OF THE LEFT KNEE WITHOUT CONTRAST TECHNIQUE: Multidetector CT imaging of the left knee was performed according to the standard protocol. Multiplanar CT image reconstructions were also generated. COMPARISON:  Plain films left knee earlier today. FINDINGS: Bones/Joint/Cartilage The patient has a fracture of the posterior, lateral corner of the tibial  plateau. Area of involvement measures up to 1.5 cm transverse by 1.5 cm AP. Depression is greatest at the posterior margin of the fracture where it measures 0.9 cm. No fracture or other bony abnormality seen. Small joint effusion is noted. Ligaments Suboptimally assessed by CT. The cruciate and collateral ligaments appear intact. Muscles and Tendons Intact. Soft tissues Negative. IMPRESSION: Acute, mildly depressed fracture of the posterior corner of the lateral tibial plateau as described above. Electronically Signed   By: Drusilla Kanner M.D.   On: 04/22/2020 14:40   CT KNEE RIGHT WO CONTRAST  Result Date: 04/22/2020 CLINICAL DATA:  Right tibial plateau  fracture after falling off a ladder. EXAM: CT OF THE RIGHT KNEE WITHOUT CONTRAST TECHNIQUE: Multidetector CT imaging of the right knee was performed according to the standard protocol. Multiplanar CT image reconstructions were also generated. COMPARISON:  Right knee x-rays from same day. FINDINGS: Bones/Joint/Cartilage Acute comminuted fracture of the tibial plateau. Oblique longitudinal component through the medial tibial plateau and posterior tibial spine with up to 4.0 cm posterior displacement and mild anterior angulation of the posterior fragment. The fragment encompasses approximately half of the medial tibial plateau. Significant comminution of the central tibial spine with minimal displacement. Small avulsion and impaction fracture of the posterior lateral tibial plateau. No additional fracture. No dislocation. Small lipohemarthrosis. Ligaments Ligaments are suboptimally evaluated by CT. Muscles and Tendons Grossly intact.  No muscle atrophy. Soft tissue Superficial subcutaneous hemorrhage in the posterior knee and proximal lower leg. No fluid collection. No soft tissue mass. IMPRESSION: 1. Acute comminuted bicondylar tibial plateau fracture as described above. 2. Small lipohemarthrosis. Electronically Signed   By: Obie Dredge M.D.   On: 04/22/2020 14:46   DG Knee Complete 4 Views Left  Result Date: 04/22/2020 CLINICAL DATA:  Larey Seat 15 feet from a ladder, coming down on feet, LEFT knee tightness/pain, greater LEFT knee pain EXAM: LEFT KNEE - COMPLETE 4+ VIEW COMPARISON:  None FINDINGS: Osseous mineralization normal. Joint spaces preserved. Minimally depressed fracture at the posterior margin of the LEFT lateral tibial plateau with associated small joint effusion. No additional fracture, dislocation, or bone destruction. IMPRESSION: Minimally depressed fracture at the posterior margin of the LEFT lateral tibial plateau. Small associated joint effusion. Electronically Signed   By: Ulyses Southward M.D.   On:  04/22/2020 12:29   DG Knee Complete 4 Views Right  Result Date: 04/22/2020 CLINICAL DATA:  Fall today, fell 15 feet from a ladder, RIGHT knee pain EXAM: RIGHT KNEE - COMPLETE 4+ VIEW COMPARISON:  None FINDINGS: Osseous mineralization normal. Comminuted fracture of the medial tibial plateau extending into the tibial spines. Posterior displacement of the dominant posterior fracture fragment. Femur and patella appear intact. Posterior subluxation of the RIGHT lateral femoral condyle at the lateral compartment without frank dislocation. Small associated joint effusion. IMPRESSION: Comminuted fracture of the medial tibial plateau extending into the tibial spines with posterior displacement of the dominant posterior tibial fragment. Posterior subluxation of RIGHT lateral femoral condyle at the lateral compartment without frank dislocation. Electronically Signed   By: Ulyses Southward M.D.   On: 04/22/2020 12:27   DG C-Arm 1-60 Min  Result Date: 04/22/2020 CLINICAL DATA:  External fixation of the right leg. EXAM: RIGHT KNEE - 1-2 VIEW; DG C-ARM 1-60 MIN COMPARISON:  April 22, 2020. FINDINGS: Fluoro time: 26 seconds. Eight C-arm fluoroscopic images were obtained intraoperatively and submitted for post operative interpretation. These images demonstrate external fixation of the right leg for treatment of an acute comminuted bicondylar  tibial plateau fracture. Please see the performing provider's procedural report for further detail. IMPRESSION: Intraoperative fluoroscopy, as detailed above. Electronically Signed   By: Feliberto Harts MD   On: 04/22/2020 18:20    ROS No recent fever, bleeding abnormalities, urologic dysfunction, GI problems, or weight gain. Blood pressure 137/76, pulse 76, temperature 98 F (36.7 C), temperature source Oral, resp. rate 18, height 6\' 2"  (1.88 m), weight 127 kg, SpO2 100 %. Physical Exam NCAT, A&O x 4 No wheezing or lung retractions Abd nondistended LLE No traumatic wounds,  ecchymosis, or rash  Minimally tender   No ankle effusion  Knee stable to varus/ valgus and anterior/posterior stress at full extension and 30 degrees of flexion  Pain only at > 100 degrees  Sens DPN, SPN, TN intact  Motor EHL, ext, flex, evers 5/5  DP 2+, PT 2+, No significant edema RLE Pin site dressings intact, clean, dry  Edema/ swelling considerable with associated ecchymosis particularly posteriorly  Sens: DPN, SPN, TN intact  Motor: EHL, FHL, and lessor toe ext and flex all intact grossly  Brisk cap refill, warm to touch    Assessment/Plan: Bilateral tibial plateau fractures, right worse than left and s/p external fixation Moderately severe swelling RLE  Would still hope for early repair but swelling may preclude I have applied soft compressive dressing from foot to thigh with Kerlix and Ace wraps and increased elevation of the RLE.  We will treat the left tibial plateau fracture closed and no manipulation is required. Continue with AROM of the left knee. Will perform examination under anesthesia to assess ligaments in OR. May need an MRI but hopeful can adequately determine without. We will reassess the RLE soft tissues on Monday am. Likely will need to delay until at least Tue. With bilateral injuries and being away from home aggressive edema control in the hospital continues to be most advisable course. Bed to chair transfers only for now. Probable WB in hinge brace on the left for transfers if holds up clinically.  I have reviewed these findings and communicated planned treatment directly with Dr. Monday; I am grateful for the opportunity to assume care on Monday at his direction. Dr. Sunday is covering for me tomorrow.  Thurston Hole, MD [facility], [facility] [phone]  04/23/2020  1:33 PM

## 2020-04-23 NOTE — Progress Notes (Signed)
Patient ID: Jacob Daniels, male   DOB: 07-Dec-1990, 30 y.o.   MRN: [id number] The patient is awake and alert and comfortable this morning.  He reports only mild pain with his right knee and almost no pain with his left knee.  He is actually bending his left knee back and forth in the bed.  He has a far lateral tibial plateau fracture on the left knee.  We did stand the right knee with external fixation late yesterday.  He understands why this needed to be done.  On exam today, his right calf is soft and his right foot is well-perfused with normal sensation.  There is more extensive bruising at the posterior aspect of his knee around the popliteal fossa area and some swelling medially but there are no fracture blisters and the swelling is only mild thus far.  He understands and will ice and elevate the right knee this weekend with the potential plans for surgery on Monday by Dr. Carola Frost for definitive fixation of the fracture if the soft tissue allows.  I do have him on Lovenox for DVT coverage.  All questions and concerns were answered and addressed.  I did share with him his x-rays as well.

## 2020-04-23 NOTE — Plan of Care (Signed)

## 2020-04-23 NOTE — Op Note (Signed)
NAME: [facility], [place] MEDICAL RECORD NO: [id number] ACCOUNT NO: [id number] DATE OF BIRTH: 02/24/1991 FACILITY: [facility] LOCATION: [facility] PHYSICIAN: Vanita Panda. Magnus Ivan, MD  Operative Report   DATE OF PROCEDURE: 04/22/2020  PREOPERATIVE DIAGNOSIS:  Right knee with closed displaced tibial plateau fracture.  POSTOPERATIVE DIAGNOSIS:  Right knee with closed displaced tibial plateau fracture.  PROCEDURE:  Uniplanar external fixation placement spanning the right knee.  IMPLANTS:  Zimmer large external fixator spanning the knee in a uniplane format.  SURGEON:  Vanita Panda. Magnus Ivan, MD  ASSISTANT:  Richardean Canal, PA-C  ANESTHESIA:  General.  ESTIMATED BLOOD LOSS:  Minimal.  ANTIBIOTICS:  3 grams IV Ancef.  COMPLICATIONS:  None.  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  He was brought to the [facility] Emergency Room and found to have  bilateral knee tibial plateau fractures.  The left knee has a lateral tibial plateau fracture that is far lateral and minimally displaced.  However, the right knee has a comminuted tibial plateau fracture that is mainly posteromedial.  We have  recommended external fixation for the right lower extremity, spanning the knee joint to gain some ligamentotaxis in order to take pressure off the soft tissues for definitive treatment at a later date.  Fortunately, he is not having a lot of pain and he  is neurovascularly intact.  His compartments are soft.  He understands why we need to do this today.  He is not having a significant amount of swelling, so we have consulted the orthopedic traumatologist, who are considering definitive fixation of his  right tibial plateau early next week.  He understands why we need to do the surgery today.  The risks and benefits have been described in detail and informed consent  obtained.  PROCEDURE DESCRIPTION:  After informed consent was obtained, appropriate right knee was marked.  He was brought to the operating room and placed supine on the operating table.  General anesthesia was then obtained.  His right thigh, knee, leg, ankle and  foot were prepped and draped with DuraPrep and sterile drapes.  A timeout was called and he is identified as correct patient, correct right lower extremity and right knee.  We then made 2 small stab incisions over the femur proximally and 2 small stab  incisions distally at the anteromedial tibia.  We placed an external fixation pins from anterior to posterior with two going through the femur and two going through the tibia.  We then constructed our uniplane external fixation with two long bars.  We  locked these distally and then under direct fluoroscopic guidance pulled traction in order to get the knee as best aligned as we could to take pressure off the soft tissues and allow for ligamentotaxis.  We then locked the external fixation down.   Dressings were applied around the external fixation pins.  He was awakened, extubated, and taken to recovery room in stable condition with all final counts being correct.  No complications noted.  Postoperatively, he will be admitted as an inpatient.  We  will start him on Lovenox tomorrow for DVT prophylaxis with again consulting the orthopedic traumatologist for potential definitive fixation of the right tibial plateau early next week.   PAA D: 04/22/2020 5:59:12 pm T: 04/23/2020 3:08:00 am  JOB: [id number]/ [id number]

## 2020-04-24 ENCOUNTER — Other Ambulatory Visit: Payer: Self-pay

## 2020-04-24 ENCOUNTER — Encounter (HOSPITAL_COMMUNITY): Payer: Self-pay | Admitting: Orthopaedic Surgery

## 2020-04-24 LAB — SURGICAL PCR SCREEN
MRSA, PCR: NEGATIVE
Staphylococcus aureus: NEGATIVE

## 2020-04-24 NOTE — Plan of Care (Signed)

## 2020-04-24 NOTE — Progress Notes (Signed)
     Subjective: 2 Days Post-Op Procedure(s) (LRB): EXTERNAL FIXATION LEG (Right) Right knee spanning Ex Fix for right tibial plateau fracture. Dressing intact and dry Mild pain. Normal sensation right leg and foot. Generallized Discomfort related to fall from height.No back or neck or pelvis pain.  Patient reports pain as mild.    Objective:   VITALS:  Temp:  [97.5 F (36.4 C)-98.2 F (36.8 C)] 97.5 F (36.4 C) (02/27 0806) Pulse Rate:  [67-88] 67 (02/27 0806) Resp:  [17-18] 18 (02/27 0806) BP: (132-137)/(64-76) 136/71 (02/27 0806) SpO2:  [98 %-100 %] 98 % (02/27 0806)  Neurologically intact ABD soft Neurovascular intact Sensation intact distally Intact pulses distally Dorsiflexion/Plantar flexion intact Incision: dressing C/D/I and no drainage Compartment soft   LABS Recent Labs    04/23/20 0124  HGB 13.4  WBC 13.8*  PLT 320   Recent Labs    04/23/20 0124  CREATININE 0.85   No results for input(s): LABPT, INR in the last 72 hours.   Assessment/Plan: 2 Days Post-Op Procedure(s) (LRB): EXTERNAL FIXATION LEG (Right)  Advance diet Up with therapy  Dr. Carola Frost plans to assess right leg skin tomorrow to decide of timing of Right tibial plateau ORIF.   Vira Browns 04/24/2020, 11:46 AMPatient ID: Jacob Daniels, male   DOB: [birth year], 30 y.o.   MRN: [id number]

## 2020-04-25 ENCOUNTER — Encounter (HOSPITAL_COMMUNITY): Payer: Self-pay | Admitting: Orthopaedic Surgery

## 2020-04-25 MED ORDER — ENSURE PRE-SURGERY PO LIQD
296.0000 mL | Freq: Once | ORAL | Status: AC
  Administered 2020-04-26: 296 mL via ORAL
  Filled 2020-04-25: qty 296

## 2020-04-25 NOTE — Plan of Care (Signed)
Applied ice to R lower extremity an educated patient to stay NPO@MN  due to possibly going to surgery in AM.   Problem: Education: Goal: Knowledge of General Education information will improve Description: Including pain rating scale, medication(s)/side effects and non-pharmacologic comfort measures Outcome: Progressing   Problem: Activity: Goal: Risk for activity intolerance will decrease Outcome: Progressing   Problem: Pain Managment: Goal: General experience of comfort will improve Outcome: Progressing   Problem: Safety: Goal: Ability to remain free from injury will improve Outcome: Progressing   Problem: Skin Integrity: Goal: Risk for impaired skin integrity will decrease Outcome: Progressing

## 2020-04-25 NOTE — Progress Notes (Signed)
Orthopedic Tech Progress Note Patient Details:  Jacob Daniels 30-Nov-1990 [id number]  Ortho Devices Type of Ortho Device: Cotton web roll,Ace wrap Ortho Device/Splint Location: RLE Ortho Device/Splint Interventions: Ordered,Application,Adjustment   Post Interventions Patient Tolerated: Well Instructions Provided: Care of device   Donald Pore 04/25/2020, 1:42 PM

## 2020-04-25 NOTE — Anesthesia Preprocedure Evaluation (Addendum)
Anesthesia Evaluation  Patient identified by MRN, date of birth, ID band Patient awake    Reviewed: Allergy & Precautions, H&P , NPO status , Patient's Chart, lab work & pertinent test results  Airway Mallampati: I  TM Distance: >3 FB Neck ROM: Full    Dental no notable dental hx.    Pulmonary neg pulmonary ROS,    Pulmonary exam normal breath sounds clear to auscultation       Cardiovascular negative cardio ROS Normal cardiovascular exam Rhythm:Regular Rate:Normal     Neuro/Psych negative neurological ROS  negative psych ROS   GI/Hepatic negative GI ROS, Neg liver ROS,   Endo/Other  Obesity BMI 36  Renal/GU negative Renal ROS  negative genitourinary   Musculoskeletal negative musculoskeletal ROS (+)   Abdominal   Peds negative pediatric ROS (+)  Hematology negative hematology ROS (+)   Anesthesia Other Findings Tibial plateau fracture  Reproductive/Obstetrics negative OB ROS                            Anesthesia Physical Anesthesia Plan  ASA: II  Anesthesia Plan: General   Post-op Pain Management:    Induction:   PONV Risk Score and Plan: 2 and Midazolam, Treatment may vary due to age or medical condition, Ondansetron and Dexamethasone  Airway Management Planned: Oral ETT  Additional Equipment: None  Intra-op Plan:   Post-operative Plan: Extubation in OR  Informed Consent: I have reviewed the patients History and Physical, chart, labs and discussed the procedure including the risks, benefits and alternatives for the proposed anesthesia with the patient or authorized representative who has indicated his/her understanding and acceptance.     Dental advisory given  Plan Discussed with: CRNA, Anesthesiologist and Surgeon  Anesthesia Plan Comments:       Anesthesia Quick Evaluation

## 2020-04-25 NOTE — Progress Notes (Signed)
Orthopaedic Trauma Service Progress Note  Patient ID: Jacob Daniels MRN: [id number] DOB/AGE: 32-15-1992 32 y.o.  Subjective:  Doing well Pain tolerable  No specific complaints   ROS As above  Objective:   VITALS:   Vitals:   04/24/20 1546 04/24/20 2020 04/25/20 0430 04/25/20 0747  BP: 134/67 (!) 148/83 (!) 144/86 (!) 150/84  Pulse: 70 76 72 76  Resp: 19 18 17 17   Temp: 98.3 F (36.8 C) 98 F (36.7 C) 98.2 F (36.8 C) 97.9 F (36.6 C)  TempSrc: Oral Oral Oral Oral  SpO2: 98% 98% 98% 97%  Weight:      Height:        Estimated body mass index is 35.95 kg/m as calculated from the following:   Height as of this encounter: 6\' 2"  (1.88 m).   Weight as of this encounter: 127 kg.   Intake/Output      02/27 0701 02/28 0700 02/28 0701 03/01 0700   P.O. 720    I.V. (mL/kg) 789.7 (6.2)    IV Piggyback 0    Total Intake(mL/kg) 1509.7 (11.9)    Urine (mL/kg/hr) 1100 (0.4)    Total Output 1100    Net +409.7           LABS  Results for orders placed or performed during the hospital encounter of 04/22/20 (from the past 24 hour(s))  Surgical pcr screen     Status: None   Collection Time: 04/24/20 10:07 PM   Specimen: Nasal Mucosa; Nasal Swab  Result Value Ref Range   MRSA, PCR NEGATIVE NEGATIVE   Staphylococcus aureus NEGATIVE NEGATIVE     PHYSICAL EXAM:   Gen: Resting comfortably in bed, no acute distress, pleasant Lungs: Unlabored Cardiac: RRR Ext:       Right lower extremity  Exfix intact, pin sites look great  Compressive dressing removed   Swelling much improved   Skin wrinkles with gentle compression around proximal tibia including posteriorly  Extremity is warm  + DP pulse  No pitting edema  DPN, SPN, TN sensory function intact  EHL, FHL, lesser toe motor function intact.  Ankle flexion, extension, inversion and eversion are intact  No pain out of proportion with passive  stretching of his toes or ankle  Pretty significant ecchymosis to the distal thigh and proximal calf but stable  No fracture blisters  Assessment/Plan: 3 Days Post-Op   Active Problems:   Tibial plateau fracture, right   Closed fracture of right tibial plateau, initial encounter   Anti-infectives (From admission, onward)   Start     Dose/Rate Route Frequency Ordered Stop   04/23/20 0600  ceFAZolin (ANCEF) 3 g in dextrose 5 % 50 mL IVPB        3 g 100 mL/hr over 30 Minutes Intravenous On call to O.R. 04/22/20 1604 04/22/20 1719   04/23/20 0600  ceFAZolin (ANCEF) 3 g in dextrose 5 % 50 mL IVPB  Status:  Discontinued        3 g 100 mL/hr over 30 Minutes Intravenous On call to O.R. 04/22/20 1957 04/22/20 2014    .  POD/HD#: 66  30 year old male fall from roof approximately 15 feet with bilateral tibial plateau fractures  -Work-related injury, fall from roof  -Complex right tibial plateau fracture, shear pattern  Soft tissue swelling has improved  significantly.  OR tomorrow for definitive ORIF and removal of external fixator  Anticipate posterior approach  Aggressive ice and elevation.  We will have [employer] applied new bulky compressive wrap from foot to thigh  Continue with toe and ankle motion as tolerated   He will be nonweightbearing now and for 8 weeks postop.  We will start range of motion immediately postop of the right knee as well  -Left tibial plateau fracture  Appears to be nonoperative and more suggestive of ligamentous injury.  We will perform evaluation under anesthesia tomorrow and activity to be determined based on exam.  But he will be permitted unrestricted range of motion of his left knee  - Pain management:  Multimodal  - ABL anemia/Hemodynamics  BP running a little high continue to monitor  - Medical issues   No chronic medical issues  - DVT/PE prophylaxis:  Hold tomorrow morning's dose of Lovenox.  Resume anticoagulation postop.  Will likely convert  him to weightbase therapy and consider DOAC postop as well  Mobilize with therapies  - ID:   Perioperative antibiotics  - Metabolic Bone Disease:  Will check basic metabolic bone labs  - Activity:  Nonweightbearing right leg  Weight-bear as tolerated left leg to help with transfers.  Okay to get the chair today  PT and OT evaluations postop  - FEN/GI prophylaxis/Foley/Lines:  Regular diet  Npo after midnight  -Ex-fix/Splint care:  Okay to manipulate right leg by fixator  - Dispo:  OR tomorrow for ORIF right tibial plateau and evaluation of left knee under anesthesia   Mearl Latin, PA-C [phone] (C) 04/25/2020, 9:52 AM  [facility] [address] [place] [address] [phone] [phone] (F)    After 5pm and on the weekends please log on to Amion, go to orthopaedics and the look under the [facility] Call for the provider(s) on call. You can also call our office at [phone] and then follow the prompts to be connected to the call team.

## 2020-04-25 NOTE — Plan of Care (Signed)
  Problem: Clinical Measurements: Goal: Ability to maintain clinical measurements within normal limits will improve Outcome: Progressing Goal: Will remain free from infection Outcome: Progressing Goal: Cardiovascular complication will be avoided Outcome: Progressing   

## 2020-04-26 ENCOUNTER — Inpatient Hospital Stay (HOSPITAL_COMMUNITY): Payer: Worker's Compensation | Admitting: Anesthesiology

## 2020-04-26 ENCOUNTER — Encounter (HOSPITAL_COMMUNITY)
Admission: EM | Disposition: A | Discharge status: Home / Self Care | Payer: Self-pay | Source: Home / Self Care | Attending: Orthopedic Surgery

## 2020-04-26 ENCOUNTER — Inpatient Hospital Stay (HOSPITAL_COMMUNITY): Payer: Worker's Compensation

## 2020-04-26 ENCOUNTER — Encounter (HOSPITAL_COMMUNITY): Payer: Self-pay | Admitting: Orthopaedic Surgery

## 2020-04-26 HISTORY — PX: ORIF TIBIA PLATEAU: SHX2132

## 2020-04-26 HISTORY — PX: EXTERNAL FIXATION LEG: SHX1549

## 2020-04-26 SURGERY — OPEN REDUCTION INTERNAL FIXATION (ORIF) TIBIAL PLATEAU
Anesthesia: General | Site: Leg Upper | Laterality: Right

## 2020-04-26 MED ORDER — 0.9 % SODIUM CHLORIDE (POUR BTL) OPTIME
TOPICAL | Status: DC | PRN
  Administered 2020-04-26: 1000 mL

## 2020-04-26 MED ORDER — APIXABAN 2.5 MG PO TABS
2.5000 mg | ORAL_TABLET | Freq: Two times a day (BID) | ORAL | Status: DC
  Administered 2020-04-27 – 2020-04-29 (×5): 2.5 mg via ORAL
  Filled 2020-04-26 (×5): qty 1

## 2020-04-26 MED ORDER — VANCOMYCIN HCL 1000 MG IV SOLR
INTRAVENOUS | Status: AC
  Filled 2020-04-26: qty 1000

## 2020-04-26 MED ORDER — VANCOMYCIN HCL 1000 MG IV SOLR
INTRAVENOUS | Status: DC | PRN
  Administered 2020-04-26: 1000 mg

## 2020-04-26 MED ORDER — LIDOCAINE 2% (20 MG/ML) 5 ML SYRINGE
INTRAMUSCULAR | Status: AC
  Filled 2020-04-26: qty 5

## 2020-04-26 MED ORDER — ACETAMINOPHEN 10 MG/ML IV SOLN
1000.0000 mg | Freq: Once | INTRAVENOUS | Status: DC | PRN
Start: 2020-04-26 — End: 2020-04-26

## 2020-04-26 MED ORDER — ONDANSETRON HCL 4 MG/2ML IJ SOLN
INTRAMUSCULAR | Status: AC
  Filled 2020-04-26: qty 2

## 2020-04-26 MED ORDER — CHLORHEXIDINE GLUCONATE 0.12 % MT SOLN
15.0000 mL | Freq: Once | OROMUCOSAL | Status: AC
  Administered 2020-04-26: 15 mL via OROMUCOSAL
  Filled 2020-04-26: qty 15

## 2020-04-26 MED ORDER — DEXMEDETOMIDINE (PRECEDEX) IN NS 20 MCG/5ML (4 MCG/ML) IV SYRINGE
PREFILLED_SYRINGE | INTRAVENOUS | Status: DC | PRN
  Administered 2020-04-26: 12 ug via INTRAVENOUS
  Administered 2020-04-26: 8 ug via INTRAVENOUS
  Administered 2020-04-26: 12 ug via INTRAVENOUS

## 2020-04-26 MED ORDER — ARTIFICIAL TEARS OPHTHALMIC OINT
TOPICAL_OINTMENT | OPHTHALMIC | Status: AC
  Filled 2020-04-26: qty 3.5

## 2020-04-26 MED ORDER — FENTANYL CITRATE (PF) 250 MCG/5ML IJ SOLN
INTRAMUSCULAR | Status: AC
  Filled 2020-04-26: qty 5

## 2020-04-26 MED ORDER — PROPOFOL 10 MG/ML IV BOLUS
INTRAVENOUS | Status: DC | PRN
  Administered 2020-04-26: 200 mg via INTRAVENOUS

## 2020-04-26 MED ORDER — FENTANYL CITRATE (PF) 250 MCG/5ML IJ SOLN
INTRAMUSCULAR | Status: DC | PRN
  Administered 2020-04-26: 100 ug via INTRAVENOUS
  Administered 2020-04-26: 50 ug via INTRAVENOUS
  Administered 2020-04-26: 100 ug via INTRAVENOUS

## 2020-04-26 MED ORDER — OXYCODONE HCL 5 MG/5ML PO SOLN: 5.0000 mg | Freq: Once | ORAL | Status: DC | PRN

## 2020-04-26 MED ORDER — PROPOFOL 10 MG/ML IV BOLUS
INTRAVENOUS | Status: AC
  Filled 2020-04-26: qty 20

## 2020-04-26 MED ORDER — DEXAMETHASONE SODIUM PHOSPHATE 10 MG/ML IJ SOLN
INTRAMUSCULAR | Status: AC
  Filled 2020-04-26: qty 1

## 2020-04-26 MED ORDER — LIDOCAINE 2% (20 MG/ML) 5 ML SYRINGE
INTRAMUSCULAR | Status: DC | PRN
  Administered 2020-04-26: 100 mg via INTRAVENOUS

## 2020-04-26 MED ORDER — AMISULPRIDE (ANTIEMETIC) 5 MG/2ML IV SOLN: 10.0000 mg | Freq: Once | INTRAVENOUS | Status: DC | PRN

## 2020-04-26 MED ORDER — GABAPENTIN 300 MG PO CAPS
300.0000 mg | ORAL_CAPSULE | Freq: Three times a day (TID) | ORAL | Status: DC
  Administered 2020-04-26 – 2020-04-29 (×10): 300 mg via ORAL
  Filled 2020-04-26 (×10): qty 1

## 2020-04-26 MED ORDER — HYDROMORPHONE HCL 1 MG/ML IJ SOLN
INTRAMUSCULAR | Status: DC | PRN
  Administered 2020-04-26: .5 mg via INTRAVENOUS

## 2020-04-26 MED ORDER — GLYCOPYRROLATE 0.2 MG/ML IJ SOLN
INTRAMUSCULAR | Status: DC | PRN
  Administered 2020-04-26: .4 mg via INTRAVENOUS
  Administered 2020-04-26 (×2): .1 mg via INTRAVENOUS

## 2020-04-26 MED ORDER — VITAMIN D 25 MCG (1000 UNIT) PO TABS
2000.0000 [IU] | ORAL_TABLET | Freq: Two times a day (BID) | ORAL | Status: DC
  Administered 2020-04-26 – 2020-04-29 (×7): 2000 [IU] via ORAL
  Filled 2020-04-26 (×7): qty 2

## 2020-04-26 MED ORDER — MIDAZOLAM HCL 2 MG/2ML IJ SOLN
INTRAMUSCULAR | Status: DC | PRN
  Administered 2020-04-26: 2 mg via INTRAVENOUS

## 2020-04-26 MED ORDER — PROMETHAZINE HCL 25 MG/ML IJ SOLN: 6.2500 mg | INTRAMUSCULAR | Status: DC | PRN

## 2020-04-26 MED ORDER — ONDANSETRON HCL 4 MG/2ML IJ SOLN
INTRAMUSCULAR | Status: DC | PRN
  Administered 2020-04-26: 4 mg via INTRAVENOUS

## 2020-04-26 MED ORDER — HYDROMORPHONE HCL 1 MG/ML IJ SOLN: 0.2500 mg | INTRAMUSCULAR | Status: DC | PRN

## 2020-04-26 MED ORDER — ROCURONIUM BROMIDE 10 MG/ML (PF) SYRINGE
PREFILLED_SYRINGE | INTRAVENOUS | Status: DC | PRN
  Administered 2020-04-26: 80 mg via INTRAVENOUS

## 2020-04-26 MED ORDER — DEXTROSE 5 % IV SOLN
3.0000 g | Freq: Once | INTRAVENOUS | Status: AC
  Administered 2020-04-26: 3 g via INTRAVENOUS
  Filled 2020-04-26: qty 3

## 2020-04-26 MED ORDER — DEXMEDETOMIDINE (PRECEDEX) IN NS 20 MCG/5ML (4 MCG/ML) IV SYRINGE
PREFILLED_SYRINGE | INTRAVENOUS | Status: AC
  Filled 2020-04-26: qty 5

## 2020-04-26 MED ORDER — CISATRACURIUM BESYLATE (PF) 10 MG/5ML IV SOLN
INTRAVENOUS | Status: DC | PRN
  Administered 2020-04-26: 2 mg via INTRAVENOUS

## 2020-04-26 MED ORDER — GLYCOPYRROLATE PF 0.2 MG/ML IJ SOSY
PREFILLED_SYRINGE | INTRAMUSCULAR | Status: AC
  Filled 2020-04-26: qty 1

## 2020-04-26 MED ORDER — METHOCARBAMOL 500 MG PO TABS
1000.0000 mg | ORAL_TABLET | Freq: Three times a day (TID) | ORAL | Status: DC
  Administered 2020-04-26 – 2020-04-29 (×10): 1000 mg via ORAL
  Filled 2020-04-26 (×10): qty 2

## 2020-04-26 MED ORDER — OXYCODONE HCL 5 MG PO TABS: 5.0000 mg | ORAL_TABLET | Freq: Once | ORAL | Status: DC | PRN

## 2020-04-26 MED ORDER — ACETAMINOPHEN 325 MG PO TABS
650.0000 mg | ORAL_TABLET | Freq: Three times a day (TID) | ORAL | Status: DC
  Administered 2020-04-26 – 2020-04-29 (×10): 650 mg via ORAL
  Filled 2020-04-26 (×10): qty 2

## 2020-04-26 MED ORDER — ALBUTEROL SULFATE HFA 108 (90 BASE) MCG/ACT IN AERS
INHALATION_SPRAY | RESPIRATORY_TRACT | Status: DC | PRN
  Administered 2020-04-26 (×2): 2 via RESPIRATORY_TRACT

## 2020-04-26 MED ORDER — HYDROMORPHONE HCL 1 MG/ML IJ SOLN
INTRAMUSCULAR | Status: AC
  Filled 2020-04-26: qty 0.5

## 2020-04-26 MED ORDER — NEOSTIGMINE METHYLSULFATE 10 MG/10ML IV SOLN
INTRAVENOUS | Status: DC | PRN
  Administered 2020-04-26: 3 mg via INTRAVENOUS

## 2020-04-26 MED ORDER — DIPHENHYDRAMINE HCL 50 MG/ML IJ SOLN
INTRAMUSCULAR | Status: DC | PRN
  Administered 2020-04-26: 12.5 mg via INTRAVENOUS

## 2020-04-26 MED ORDER — LACTATED RINGERS IV SOLN: INTRAVENOUS | Status: DC | PRN

## 2020-04-26 MED ORDER — DEXAMETHASONE SODIUM PHOSPHATE 10 MG/ML IJ SOLN
INTRAMUSCULAR | Status: DC | PRN
  Administered 2020-04-26: 10 mg via INTRAVENOUS

## 2020-04-26 MED ORDER — LACTATED RINGERS IV SOLN: INTRAVENOUS | Status: DC

## 2020-04-26 MED ORDER — DIPHENHYDRAMINE HCL 50 MG/ML IJ SOLN
INTRAMUSCULAR | Status: AC
  Filled 2020-04-26: qty 1

## 2020-04-26 MED ORDER — ROCURONIUM BROMIDE 10 MG/ML (PF) SYRINGE
PREFILLED_SYRINGE | INTRAVENOUS | Status: AC
  Filled 2020-04-26: qty 10

## 2020-04-26 MED ORDER — MORPHINE SULFATE (PF) 2 MG/ML IV SOLN
1.0000 mg | INTRAVENOUS | Status: DC | PRN
  Administered 2020-04-27: 2 mg via INTRAVENOUS
  Filled 2020-04-26: qty 1

## 2020-04-26 MED ORDER — MIDAZOLAM HCL 2 MG/2ML IJ SOLN
INTRAMUSCULAR | Status: AC
  Filled 2020-04-26: qty 2

## 2020-04-26 MED ORDER — ASCORBIC ACID 500 MG PO TABS
1000.0000 mg | ORAL_TABLET | Freq: Every day | ORAL | Status: DC
  Administered 2020-04-26 – 2020-04-29 (×4): 1000 mg via ORAL
  Filled 2020-04-26 (×4): qty 2

## 2020-04-26 SURGICAL SUPPLY — 84 items
BANDAGE ESMARK 6X9 LF (GAUZE/BANDAGES/DRESSINGS) ×2 IMPLANT
BIT DRILL CALIBR QC 2.5X250 (BIT) ×3 IMPLANT
BIT DRILL CALIBR QC 2.8X250 (BIT) ×3 IMPLANT
BIT DRILL QC 3.5 150 (BIT) ×3 IMPLANT
BLADE CLIPPER SURG (BLADE) ×3 IMPLANT
BLADE SURG 10 STRL SS (BLADE) ×3 IMPLANT
BLADE SURG 15 STRL LF DISP TIS (BLADE) ×2 IMPLANT
BLADE SURG 15 STRL SS (BLADE) ×1
BNDG COHESIVE 4X5 TAN STRL (GAUZE/BANDAGES/DRESSINGS) ×3 IMPLANT
BNDG ELASTIC 4X5.8 VLCR STR LF (GAUZE/BANDAGES/DRESSINGS) ×3 IMPLANT
BNDG ELASTIC 6X10 VLCR STRL LF (GAUZE/BANDAGES/DRESSINGS) ×3 IMPLANT
BNDG ELASTIC 6X5.8 VLCR STR LF (GAUZE/BANDAGES/DRESSINGS) ×3 IMPLANT
BNDG ESMARK 6X9 LF (GAUZE/BANDAGES/DRESSINGS) ×3
BNDG GAUZE ELAST 4 BULKY (GAUZE/BANDAGES/DRESSINGS) ×3 IMPLANT
BRUSH SCRUB EZ PLAIN DRY (MISCELLANEOUS) ×6 IMPLANT
CANISTER SUCT 3000ML PPV (MISCELLANEOUS) ×3 IMPLANT
COVER SURGICAL LIGHT HANDLE (MISCELLANEOUS) ×3 IMPLANT
COVER WAND RF STERILE (DRAPES) IMPLANT
CUFF TOURN SGL QUICK 34 (TOURNIQUET CUFF) ×1
CUFF TRNQT CYL 34X4.125X (TOURNIQUET CUFF) ×2 IMPLANT
DRAPE C-ARM 42X72 X-RAY (DRAPES) ×3 IMPLANT
DRAPE C-ARMOR (DRAPES) ×3 IMPLANT
DRAPE HALF SHEET 40X57 (DRAPES) ×6 IMPLANT
DRAPE INCISE IOBAN 66X45 STRL (DRAPES) ×3 IMPLANT
DRAPE U-SHAPE 47X51 STRL (DRAPES) ×3 IMPLANT
DRSG ADAPTIC 3X8 NADH LF (GAUZE/BANDAGES/DRESSINGS) ×3 IMPLANT
DRSG MEPILEX BORDER 4X8 (GAUZE/BANDAGES/DRESSINGS) ×3 IMPLANT
DRSG MEPITEL 3X4 ME34 (GAUZE/BANDAGES/DRESSINGS) ×3 IMPLANT
DRSG PAD ABDOMINAL 8X10 ST (GAUZE/BANDAGES/DRESSINGS) ×6 IMPLANT
ELECT REM PT RETURN 9FT ADLT (ELECTROSURGICAL) ×3
ELECTRODE REM PT RTRN 9FT ADLT (ELECTROSURGICAL) ×2 IMPLANT
GAUZE SPONGE 4X4 12PLY STRL (GAUZE/BANDAGES/DRESSINGS) ×3 IMPLANT
GLOVE BIO SURGEON STRL SZ7.5 (GLOVE) ×3 IMPLANT
GLOVE BIO SURGEON STRL SZ8 (GLOVE) ×3 IMPLANT
GLOVE BIOGEL PI IND STRL 7.5 (GLOVE) ×2 IMPLANT
GLOVE BIOGEL PI INDICATOR 7.5 (GLOVE) ×1
GLOVE SRG 8 PF TXTR STRL LF DI (GLOVE) ×2 IMPLANT
GLOVE SURG UNDER POLY LF SZ8 (GLOVE) ×1
GOWN STRL REUS W/ TWL LRG LVL3 (GOWN DISPOSABLE) ×4 IMPLANT
GOWN STRL REUS W/ TWL XL LVL3 (GOWN DISPOSABLE) ×4 IMPLANT
GOWN STRL REUS W/TWL LRG LVL3 (GOWN DISPOSABLE) ×2
GOWN STRL REUS W/TWL XL LVL3 (GOWN DISPOSABLE) ×2
IMMOBILIZER KNEE 22 UNIV (SOFTGOODS) ×3 IMPLANT
IMMOBILIZER KNEE 24 THIGH 36 (MISCELLANEOUS) ×2 IMPLANT
IMMOBILIZER KNEE 24 UNIV (MISCELLANEOUS) ×3
KIT BASIN OR (CUSTOM PROCEDURE TRAY) ×3 IMPLANT
KIT TURNOVER KIT B (KITS) ×3 IMPLANT
NDL SUT 6 .5 CRC .975X.05 MAYO (NEEDLE) ×2 IMPLANT
NEEDLE MAYO TAPER (NEEDLE) ×1
NS IRRIG 1000ML POUR BTL (IV SOLUTION) ×3 IMPLANT
PACK ORTHO EXTREMITY (CUSTOM PROCEDURE TRAY) ×3 IMPLANT
PAD ARMBOARD 7.5X6 YLW CONV (MISCELLANEOUS) ×12 IMPLANT
PAD CAST 4YDX4 CTTN HI CHSV (CAST SUPPLIES) ×2 IMPLANT
PADDING CAST COTTON 4X4 STRL (CAST SUPPLIES) ×1
PADDING CAST COTTON 6X4 STRL (CAST SUPPLIES) ×3 IMPLANT
PLATE TIB PROX 3.5X79 1H (Plate) ×6 IMPLANT
SCREW CORTEX 3.5 40MM (Screw) ×1 IMPLANT
SCREW CORTEX 3.5 50MM (Screw) ×3 IMPLANT
SCREW CORTEX 3.5 60MM (Screw) ×11 IMPLANT
SCREW CORTEX 3.5X75MM (Screw) ×3 IMPLANT
SCREW LOCK CORT ST 3.5X40 (Screw) ×2 IMPLANT
SCREW LOCK T15 FT 60X3.5X2.9X (Screw) ×4 IMPLANT
SCREW LOCKING 3.5X60 (Screw) ×2 IMPLANT
SCREW LOCKING 3.5X70MM VA (Screw) ×3 IMPLANT
SPONGE LAP 18X18 RF (DISPOSABLE) ×6 IMPLANT
STAPLER VISISTAT 35W (STAPLE) ×3 IMPLANT
STOCKINETTE IMPERVIOUS LG (DRAPES) ×3 IMPLANT
SUCTION FRAZIER HANDLE 10FR (MISCELLANEOUS) ×1
SUCTION TUBE FRAZIER 10FR DISP (MISCELLANEOUS) ×2 IMPLANT
SUT ETHILON 2 0 FS 18 (SUTURE) ×6 IMPLANT
SUT ETHILON 3 0 PS 1 (SUTURE) ×3 IMPLANT
SUT PROLENE 0 CT 2 (SUTURE) ×6 IMPLANT
SUT VIC AB 0 CT1 27 (SUTURE) ×1
SUT VIC AB 0 CT1 27XBRD ANBCTR (SUTURE) ×2 IMPLANT
SUT VIC AB 1 CT1 27 (SUTURE) ×1
SUT VIC AB 1 CT1 27XBRD ANBCTR (SUTURE) ×2 IMPLANT
SUT VIC AB 2-0 CT1 27 (SUTURE) ×2
SUT VIC AB 2-0 CT1 TAPERPNT 27 (SUTURE) ×4 IMPLANT
TOWEL GREEN STERILE (TOWEL DISPOSABLE) ×6 IMPLANT
TOWEL GREEN STERILE FF (TOWEL DISPOSABLE) ×3 IMPLANT
TRAY FOLEY MTR SLVR 16FR STAT (SET/KITS/TRAYS/PACK) IMPLANT
TUBE CONNECTING 12X1/4 (SUCTIONS) ×3 IMPLANT
WATER STERILE IRR 1000ML POUR (IV SOLUTION) IMPLANT
YANKAUER SUCT BULB TIP NO VENT (SUCTIONS) ×3 IMPLANT

## 2020-04-26 NOTE — Transfer of Care (Signed)
Immediate Anesthesia Transfer of Care Note  Patient: Jacob Daniels  Procedure(s) Performed: OPEN REDUCTION INTERNAL FIXATION (ORIF) TIBIAL PLATEAU (Right Knee) EXTERNAL FIXATION LEG, removal (Right Leg Upper)  Patient Location: PACU  Anesthesia Type:General  Level of Consciousness: drowsy and patient cooperative  Airway & Oxygen Therapy: Patient Spontanous Breathing and Patient connected to face mask oxygen  Post-op Assessment: Report given to RN and Post -op Vital signs reviewed and stable  Post vital signs: Reviewed and stable  Last Vitals:  Vitals Value Taken Time  BP 150/64   Temp    Pulse 81 04/26/20 1146  Resp 18 04/26/20 1146  SpO2 94 % 04/26/20 1146  Vitals shown include unvalidated device data.  Last Pain:  Vitals:   04/26/20 0407  TempSrc: Oral  PainSc:       Patients Stated Pain Goal: 3 (04/25/20 1053)  Complications: No complications documented.

## 2020-04-26 NOTE — TOC CAGE-AID Note (Signed)
Transition of Care [facility]) - CAGE-AID Screening   Patient Details  Name: Jacob Daniels MRN: [id number] Date of Birth: 1990-11-12   Clinical Narrative:  Patient endorses occasional alcohol use but denies need for resources.  CAGE-AID Screening:    Have You Ever Felt You Ought to Cut Down on Your Drinking or Drug Use?: No Have People Annoyed You By Critizing Your Drinking Or Drug Use?: No Have You Felt Bad Or Guilty About Your Drinking Or Drug Use?: No Have You Ever Had a Drink or Used Drugs First Thing In The Morning to Steady Your Nerves or to Get Rid of a Hangover?: No CAGE-AID Score: 0  Substance Abuse Education Offered: No

## 2020-04-26 NOTE — Op Note (Signed)
12:02 PM 04/26/2020 04/26/2020  PATIENT:  Jacob Daniels  30 y.o. male  [id number]  PRE-OPERATIVE DIAGNOSIS:   1. RIGHT BICONDYLAR TIBIAL PLATEAU FRACTURE 2. TIBIAL SPINE/ EMINENCE FRACTURE 3. RETAINED SPANNING EXTERNAL FIXATOR  POST-OPERATIVE DIAGNOSIS:   1. RIGHT BICONDYLAR TIBIAL PLATEAU FRACTURE 2. TIBIAL SPINE/ EMINENCE FRACTURE 3. RETAINED SPANNING EXTERNAL FIXATOR  PROCEDURE:   1. ORIF OF RIGHT BICONDYLAR TIBIAL PLATEAU FRACTURE THROUGH POSTERIOR APPROACH 2. ORIF TIBIAL EMINENCE 3. REMOVAL OF EXTERNAL FIXATOR UNDER ANESTHESIA 4. SIMPLE CLOSURE PIN TRACT SITES 5. DEBRIDEMENT ULCERATION TIBIAL PIN SITES, SKIN AND SUBCUTANEOUS TISSUE, MUSCLE FASCIA, AND NEAR SIDE OF BONE CORTEX  SURGEON:  Surgeon(s) and Role:    Myrene Galas, MD - Primary  PHYSICIAN ASSISTANT: 1. Montez Morita, PA-C; 2. PA Student  ANESTHESIA:   general  I/O:  Total I/O In: 2050 [I.V.:2000; IV Piggyback:50] Out: 450 [Blood:450]  SPECIMEN:  None  TOURNIQUET:  No.  DICTATION: Written in Epic.  DISPOSITION: PACU  CONDITION: STABLE   BRIEF SUMMARY OF INDICATION FOR PROCEDURE:  The patient is a 30 y.o. who sustained a severely comminuted bicondylar tibial plateau fracture with medial joint line depression and posterior shear in a fall from a roof. He also fractured the contralateral side but fortunately did not have significant displacement or require operative repair.  I discussed with the patient the risks and  benefits of surgical repair, including the potential for arthritis, malunion, nonunion, loss of reduction, DVT, PE, loss of motion, arthritis, nerve injury, vessel injury, symptomatic hardware, and need for further surgery, among others.  After full  discussion, he did provide consent to proceed.  BRIEF SUMMARY OF PROCEDURE:  The patient was given 2 g of Ancef preoperatively, taken to the operating room where general anesthesia was induced.  A thorough scrub with cholorhexidine performed and then  the external fixator removed, followed by additional scrub and paint for the upper thigh. Simple suture repair and sterile dressing performed there so that the patient could be turned prone with a tourniquet around thigh in case needed. The patient was turned prone and all prominences padded appropriately.  The right lower extremity was then once more prepped and draped in standard fashion after chlorhexidine wash, Betadine  scrub, and paint.  A timeout was held.  We performed a medial approach without elevation of the tourniquet. The deep fascia was incised and the gastrocsoleous retracted laterally.   I then was able to encounter the primary exit of the fracture line posteromedially and also develop the fracture site laterally, using curettes and lavage to remove the hematoma.  My assistant pulled external rotation and extension of the knee while I used a Cobb to mobilize the medial segment, which was pinned.  I then inserted the ball spike pusher and manipulated the tibial eminence into a reduced position. While elevating these segments and compressing them two buttress plates were placed, one medially and the other laterally and centrally to secure the lateral side and eminence/ spine fragment. Standard fixation achieved compression which was checked on AP and LAT views, appearing to show anatomic reduction of the articular surfaces. Additional lag screws were placed at the joint line to further compress the articular surface and then another standard screw distally.  This was followed by locked screws. Montez Morita, PA-C, was present and assisting throughout and was necessary to achieve and maintain the reduction and also assisted with provisional instrumentation and closure, which was perfomred in standard layered fashion.  The ulcerated pin sites, which each measured  approximately 5 mm in diameter were then debrided with serial curettes at the skin, subcutaneous tissue, muscle fascia, and cortical bone  levels, excising all nonviable, contaminated, and devitalized tissue along the tracts back to healthy pink tissue.  Again, the depth of debridement was from skin to bone near cortex.  Simple closure with nylon performed after the debridement.  PROGNOSIS:  The patient will have unrestricted range of motion of the knee and ankle.  He will be transitioned to a hinged knee brace tomorrow to supply supplemental support to our repair.  DVT prophylaxis with Eliquis. He will be seen in the office in 10-14 days for removal of his sutures.  He is at elevated risk for arthritis given the articular injury .

## 2020-04-26 NOTE — Progress Notes (Signed)
03/01 7342 Report given to Janann Colonel, CRNA. Pt transported to OR. Pt denies pain. Pt stable.

## 2020-04-26 NOTE — Anesthesia Procedure Notes (Addendum)
Procedure Name: Intubation Date/Time: 04/26/2020 9:13 AM Performed by: Kathryne Hitch, CRNA Pre-anesthesia Checklist: Patient identified, Emergency Drugs available, Suction available and Patient being monitored Patient Re-evaluated:Patient Re-evaluated prior to induction Oxygen Delivery Method: Circle system utilized Preoxygenation: Pre-oxygenation with 100% oxygen Induction Type: IV induction Ventilation: Mask ventilation without difficulty Laryngoscope Size: Mac and 4 Grade View: Grade II Tube type: Oral Tube size: 7.5 mm Number of attempts: 2 Airway Equipment and Method: Stylet and Oral airway Placement Confirmation: ETT inserted through vocal cords under direct vision,  positive ETCO2 and breath sounds checked- equal and bilateral Secured at: 23 cm Tube secured with: Tape Dental Injury: Teeth and Oropharynx as per pre-operative assessment

## 2020-04-26 NOTE — Progress Notes (Addendum)
ANTICOAGULATION CONSULT NOTE - Initial Consult  Pharmacy Consult for apixaban Indication: ortho VTE prophylaxis  No Known Allergies  Patient Measurements: Height: 6\' 2"  (188 cm) Weight: 127 kg (280 lb) IBW/kg (Calculated) : 82.2   Vital Signs: Temp: 97.9 F (36.6 C) (03/01 1245) Temp Source: Oral (03/01 0407) BP: 161/85 (03/01 1245) Pulse Rate: 90 (03/01 1245)  Labs: No results for input(s): HGB, HCT, PLT, APTT, LABPROT, INR, HEPARINUNFRC, HEPRLOWMOCWT, CREATININE, CKTOTAL, CKMB, TROPONINIHS in the last 72 hours.  Estimated Creatinine Clearance: 178.3 mL/min (by C-G formula based on SCr of 0.85 mg/dL).    Assessment: 30 yo M with bilateral tibial plateau fractures after falling from a ladder now s/p multiple ortho surgery, last 3/1. Pharmacy consulted for apixaban x30d for DVT prophylaxis.    Goal of Therapy:  Monitor platelets by anticoagulation protocol: Yes   Plan:  Start apixaban 2.5mg  BID on 3/2 Monitor for signs/symptoms of bleeding   38, PharmD, BCPS, BCCP Clinical Pharmacist  Please check AMION for all [facility] phone numbers After 10:00 PM, call Main Pharmacy [phone]

## 2020-04-26 NOTE — Plan of Care (Signed)
  Problem: Activity: Goal: Risk for activity intolerance will decrease Outcome: Progressing   Problem: Pain Managment: Goal: General experience of comfort will improve Outcome: Progressing   

## 2020-04-26 NOTE — Progress Notes (Signed)
Patient describes a terrible night. Frustration with nursing care.  Dressing has been removed and swelling has not continued to come down as anticipated but instead about same as yesterday. It does just barely wrinkle. Given posterior approach and magnitude of soft tissue coverage, the patient and I discussed delay versus proceeding and he strongly wishes to proceed. Risk is manageable.  I discussed with the patient the risks and benefits of surgery for right femur, including the possibility of infection, nerve injury, vessel injury, wound breakdown, arthritis, symptomatic hardware, DVT/ PE, loss of motion, malunion, nonunion, and need for further surgery among others.  We also specifically discussed the need to stage surgery because of the elevated risk of soft tissue breakdown that could lead to amputation.  He acknowledged these risks and wished to proceed.  Myrene Galas, MD [facility], [facility] [phone]

## 2020-04-26 NOTE — Progress Notes (Signed)
Orthopedic Tech Progress Note Patient Details:  Jacob Daniels [birth year] [id number] Called in order to [facility] for a ROM BRACE UNLOCK BLE Patient ID: [name], male   DOB: 12/27/1990, 30 y.o.   MRN: [id number]   Donald Pore 04/26/2020, 1:15 PM

## 2020-04-26 NOTE — Discharge Instructions (Addendum)
Orthopaedic Trauma Service Discharge Instructions   General Discharge Instructions  Orthopaedic Injuries:  Right tibial plateau fracture treated with open reduction internal fixation using plate and screws             Internal derangement left knee including ACL tear, posterior lateral corner injury, lateral meniscal tear: Will need follow-up with orthopedic surgeon  WEIGHT BEARING STATUS: Nonweightbearing right leg.  Weight-bear as tolerated left leg with hinged brace on for transfers.  Use wheelchair for long distances  RANGE OF MOTION/ACTIVITY: Unrestricted range of motion both knees  Bone health: Labs did show some vitamin D deficiency.  Continue to take vitamin D supplements that were prescribed for you  Wound Care: Daily dressing changes starting on 05/01/2020.  Please see instructions below Discharge Wound Care Instructions  Do NOT apply any ointments, solutions or lotions to pin sites or surgical wounds.  These prevent needed drainage and even though solutions like hydrogen peroxide kill bacteria, they also damage cells lining the pin sites that help fight infection.  Applying lotions or ointments can keep the wounds moist and can cause them to breakdown and open up as well. This can increase the risk for infection. When in doubt call the office.  Surgical incisions should be dressed daily.  If any drainage is noted, use one layer of adaptic, then gauze, Kerlix, and an ace wrap.  Alternatively you can use a Mepilex type dressing which is the dressing you have on from the hospital.  Instead of an Ace wrap you can continue to use the compression sock (white stocking).  Once the incision is completely dry and without drainage, it may be left open to air out.  Showering may begin 36-48 hours later.  Cleaning gently with soap and water.  Traumatic wounds should be dressed daily as well.    One layer of adaptic, gauze, Kerlix, then ace wrap.  The adaptic can be discontinued once the  draining has ceased    If you have a wet to dry dressing: wet the gauze with saline the squeeze as much saline out so the gauze is moist (not soaking wet), place moistened gauze over wound, then place a dry gauze over the moist one, followed by Kerlix wrap, then ace wrap.   DVT/PE prophylaxis: Eliquis 2.5 mg by mouth every 12 hours x 30 days   Diet: as you were eating previously.  Can use over the counter stool softeners and bowel preparations, such as Miralax, to help with bowel movements.  Narcotics can be constipating.  Be sure to drink plenty of fluids  PAIN MEDICATION USE AND EXPECTATIONS  You have likely been given narcotic medications to help control your pain.  After a traumatic event that results in an fracture (broken bone) with or without surgery, it is ok to use narcotic pain medications to help control one's pain.  We understand that everyone responds to pain differently and each individual patient will be evaluated on a regular basis for the continued need for narcotic medications. Ideally, narcotic medication use should last no more than 6-8 weeks (coinciding with fracture healing).   As a patient it is your responsibility as well to monitor narcotic medication use and report the amount and frequency you use these medications when you come to your office visit.   We would also advise that if you are using narcotic medications, you should take a dose prior to therapy to maximize you participation.  IF YOU ARE ON NARCOTIC MEDICATIONS IT IS NOT  PERMISSIBLE TO OPERATE A MOTOR VEHICLE (MOTORCYCLE/CAR/TRUCK/MOPED) OR HEAVY MACHINERY DO NOT MIX NARCOTICS WITH OTHER CNS (CENTRAL NERVOUS SYSTEM) DEPRESSANTS SUCH AS ALCOHOL   STOP SMOKING OR USING NICOTINE PRODUCTS!!!!  As discussed nicotine severely impairs your body's ability to heal surgical and traumatic wounds but also impairs bone healing.  Wounds and bone heal by forming microscopic blood vessels (angiogenesis) and nicotine is a  vasoconstrictor (essentially, shrinks blood vessels).  Therefore, if vasoconstriction occurs to these microscopic blood vessels they essentially disappear and are unable to deliver necessary nutrients to the healing tissue.  This is one modifiable factor that you can do to dramatically increase your chances of healing your injury.    (This means no smoking, no nicotine gum, patches, etc)  DO NOT USE NONSTEROIDAL ANTI-INFLAMMATORY DRUGS (NSAID'S)  Using products such as Advil (ibuprofen), Aleve (naproxen), Motrin (ibuprofen) for additional pain control during fracture healing can delay and/or prevent the healing response.  If you would like to take over the counter (OTC) medication, Tylenol (acetaminophen) is ok.  However, some narcotic medications that are given for pain control contain acetaminophen as well. Therefore, you should not exceed more than 4000 mg of tylenol in a day if you do not have liver disease.  Also note that there are may OTC medicines, such as cold medicines and allergy medicines that my contain tylenol as well.  If you have any questions about medications and/or interactions please ask your doctor/PA or your pharmacist.      ICE AND ELEVATE INJURED/OPERATIVE EXTREMITY  Using ice and elevating the injured extremity above your heart can help with swelling and pain control.  Icing in a pulsatile fashion, such as 20 minutes on and 20 minutes off, can be followed.    Do not place ice directly on skin. Make sure there is a barrier between to skin and the ice pack.    Using frozen items such as frozen peas works well as the conform nicely to the are that needs to be iced.  USE AN ACE WRAP OR TED HOSE FOR SWELLING CONTROL  In addition to icing and elevation, Ace wraps or TED hose are used to help limit and resolve swelling.  It is recommended to use Ace wraps or TED hose until you are informed to stop.    When using Ace Wraps start the wrapping distally (farthest away from the body) and  wrap proximally (closer to the body)   Example: If you had surgery on your leg or thing and you do not have a splint on, start the ace wrap at the toes and work your way up to the thigh        If you had surgery on your upper extremity and do not have a splint on, start the ace wrap at your fingers and work your way up to the upper arm  IF YOU ARE IN A SPLINT OR CAST DO NOT [place]   If your splint gets wet for any reason please contact the office immediately. You may shower in your splint or cast as long as you keep it dry.  This can be done by wrapping in a cast cover or garbage back (or similar)  Do Not stick any thing down your splint or cast such as pencils, money, or hangers to try and scratch yourself with.  If you feel itchy take benadryl as prescribed on the bottle for itching  IF YOU ARE IN A CAM BOOT (BLACK BOOT)  You may remove boot periodically. Perform daily dressing changes as noted below.  Wash the liner of the boot regularly and wear a sock when wearing the boot. It is recommended that you sleep in the boot until told otherwise    Call office for the following:  Temperature greater than 101F  Persistent nausea and vomiting  Severe uncontrolled pain  Redness, tenderness, or signs of infection (pain, swelling, redness, odor or green/yellow discharge around the site)  Difficulty breathing, headache or visual disturbances  Hives  Persistent dizziness or light-headedness  Extreme fatigue  Any other questions or concerns you may have after discharge  In an emergency, call 911 or go to an Emergency Department at a nearby hospital  HELPFUL INFORMATION  ? If you had a block, it will wear off between 8-24 hrs postop typically.  This is period when your pain may go from nearly zero to the pain you would have had postop without the block.  This is an abrupt transition but nothing dangerous is happening.  You may take an extra dose of narcotic when this  happens.  ? You should wean off your narcotic medicines as soon as you are able.  Most patients will be off or using minimal narcotics before their first postop appointment.   ? We suggest you use the pain medication the first night prior to going to bed, in order to ease any pain when the anesthesia wears off. You should avoid taking pain medications on an empty stomach as it will make you nauseous.  ? Do not drink alcoholic beverages or take illicit drugs when taking pain medications.  ? In most states it is against the law to drive while you are in a splint or sling.  And certainly against the law to drive while taking narcotics.  ? You may return to work/school in the next couple of days when you feel up to it.   ? Pain medication may make you constipated.  Below are a few solutions to try in this order: - Decrease the amount of pain medication if you arent having pain. - Drink lots of decaffeinated fluids. - Drink prune juice and/or each dried prunes  o If the first 3 dont work start with additional solutions - Take Colace - an over-the-counter stool softener - Take Senokot - an over-the-counter laxative - Take Miralax - a stronger over-the-counter laxative     CALL THE OFFICE WITH ANY QUESTIONS OR CONCERNS: [phone]   VISIT OUR WEBSITE FOR ADDITIONAL INFORMATION: [email or web address]       Information on my medicine - ELIQUIS (apixaban)  This medication education was reviewed with me or my healthcare representative as part of my discharge preparation.     Why was Eliquis prescribed for you? Eliquis was prescribed for you to reduce the risk of blood clots forming after orthopedic surgery.    What do You need to know about Eliquis? Take your Eliquis TWICE DAILY FOR 30 days - one tablet in the morning and one tablet in the evening with or without food.  It would be best to take the dose about the same time each day. Last day 05/26/20  If you have difficulty  swallowing the tablet whole please discuss with your pharmacist how to take the medication safely.  Take Eliquis exactly as prescribed by your doctor and DO NOT stop taking Eliquis without talking to the doctor who prescribed the medication.  Stopping without other medication to take the place of Eliquis may increase  your risk of developing a clot.  After discharge, you should have regular check-up appointments with your healthcare provider that is prescribing your Eliquis.  What do you do if you miss a dose? If a dose of ELIQUIS is not taken at the scheduled time, take it as soon as possible on the same day and twice-daily administration should be resumed.  The dose should not be doubled to make up for a missed dose.  Do not take more than one tablet of ELIQUIS at the same time.  Important Safety Information A possible side effect of Eliquis is bleeding. You should call your healthcare provider right away if you experience any of the following: ? Bleeding from an injury or your nose that does not stop. ? Unusual colored urine (red or dark brown) or unusual colored stools (red or black). ? Unusual bruising for unknown reasons. ? A serious fall or if you hit your head (even if there is no bleeding).  Some medicines may interact with Eliquis and might increase your risk of bleeding or clotting while on Eliquis. To help avoid this, consult your healthcare provider or pharmacist prior to using any new prescription or non-prescription medications, including herbals, vitamins, non-steroidal anti-inflammatory drugs (NSAIDs) and supplements.  This website has more information on Eliquis (apixaban): http://[email or web address]/[email or web address]

## 2020-04-27 ENCOUNTER — Inpatient Hospital Stay (HOSPITAL_COMMUNITY): Payer: Self-pay

## 2020-04-27 ENCOUNTER — Other Ambulatory Visit (HOSPITAL_COMMUNITY): Payer: Self-pay

## 2020-04-27 ENCOUNTER — Inpatient Hospital Stay (HOSPITAL_COMMUNITY): Payer: Worker's Compensation

## 2020-04-27 LAB — CBC
HCT: 34.4 % — ABNORMAL LOW (ref 39.0–52.0)
Hemoglobin: 11.2 g/dL — ABNORMAL LOW (ref 13.0–17.0)
MCH: 27.7 pg (ref 26.0–34.0)
MCHC: 32.6 g/dL (ref 30.0–36.0)
MCV: 85.1 fL (ref 80.0–100.0)
Platelets: 299 10*3/uL (ref 150–400)
RBC: 4.04 MIL/uL — ABNORMAL LOW (ref 4.22–5.81)
RDW: 12.7 % (ref 11.5–15.5)
WBC: 16.6 10*3/uL — ABNORMAL HIGH (ref 4.0–10.5)
nRBC: 0 % (ref 0.0–0.2)

## 2020-04-27 LAB — VITAMIN D 25 HYDROXY (VIT D DEFICIENCY, FRACTURES): Vit D, 25-Hydroxy: 20.81 ng/mL — ABNORMAL LOW (ref 30–100)

## 2020-04-27 NOTE — Plan of Care (Signed)

## 2020-04-27 NOTE — Evaluation (Signed)
Occupational Therapy Evaluation Patient Details Name: Jacob Daniels MRN: [id number] DOB: 1990-12-01 Today's Date: 04/27/2020    History of Present Illness Pt is a 30 y.o. male who sustained bilat tibial plateau fxs when he fell 10-15' off ladder. Underwent external fixation R knee 2/26 then 3/1 underwent removal of external fixator and ORIF R tib plateau fx.   Clinical Impression   Pt presents with decline in function and safety with ADLs and ADL mobility with impaired balance and endurance. PTA, pt lived at home with his wife in [place], [place] and works here in [place] in [employer]. Pt was Ind with all ADLs/selfcare and mobility and currently requires mod A with LB ADLs, min - min guard A with mobility using RW. Pt would benefit from acute OT services to address impairments to maximize level of function and safety    Follow Up Recommendations  No OT follow up    Equipment Recommendations  3 in 1 bedside commode;Wheelchair (measurements OT);Wheelchair cushion (measurements OT);Other (comment) (wide BSC, RW, LH bath sponge)    Recommendations for Other Services       Precautions / Restrictions Precautions Precautions: Fall Required Braces or Orthoses: Other Brace Other Brace: hinged knee braces bilat, unrestricted ROM L knee Restrictions Weight Bearing Restrictions: Yes RLE Weight Bearing: Non weight bearing LLE Weight Bearing: Weight bearing as tolerated Other Position/Activity Restrictions: WBAT LLE in hinged knee brace      Mobility Bed Mobility               General bed mobility comments: pt seated in recliner upon arrival    Transfers Overall transfer level: Needs assistance Equipment used: Rolling walker (2 wheeled) Transfers: Sit to/from Stand Sit to Stand: Min assist         General transfer comment: cues for hand placement, assist to power up and stabilize balance    Balance Overall balance assessment: Needs assistance Sitting-balance support: No upper  extremity supported;Feet supported Sitting balance-Leahy Scale: Good     Standing balance support: Bilateral upper extremity supported;During functional activity Standing balance-Leahy Scale: Poor                             ADL either performed or assessed with clinical judgement   ADL Overall ADL's : Needs assistance/impaired Eating/Feeding: Independent;Sitting   Grooming: Wash/dry hands;Wash/dry face;Standing;Min guard;With caregiver independent assisting   Upper Body Bathing: Set up;Independent;Sitting;With caregiver independent assisting   Lower Body Bathing: Moderate assistance;With caregiver independent assisting   Upper Body Dressing : Set up;Independent;Sitting   Lower Body Dressing: Moderate assistance;With caregiver independent assisting   Toilet Transfer: Minimal assistance;Min guard;Ambulation;RW;Cueing for safety   Toileting- Clothing Manipulation and Hygiene: Minimal assistance;Sit to/from stand       Functional mobility during ADLs: Minimal assistance;Min guard;Rolling walker;Cueing for safety General ADL Comments: pt and wife educated on ADL A/E for LB selfcare     Vision Patient Visual Report: No change from baseline       Perception     Praxis      Pertinent Vitals/Pain Pain Assessment: 0-10 Pain Score: 4  Pain Location: RLE Pain Descriptors / Indicators: Sore;Discomfort Pain Intervention(s): Monitored during session;Repositioned     Hand Dominance Right   Extremity/Trunk Assessment Upper Extremity Assessment Upper Extremity Assessment: Overall WFL for tasks assessed   Lower Extremity Assessment Lower Extremity Assessment: Defer to PT evaluation   Cervical / Trunk Assessment Cervical / Trunk Assessment: Normal   Communication Communication Communication:  No difficulties   Cognition Arousal/Alertness: Awake/alert Behavior During Therapy: WFL for tasks assessed/performed Overall Cognitive Status: Within Functional Limits  for tasks assessed                                     General Comments       Exercises     Shoulder Instructions      Home Living Family/patient expects to be discharged to:: Private residence Living Arrangements: Spouse/significant other Available Help at Discharge: Family;Available 24 hours/day Type of Home: House Home Access: Stairs to enter [employer] of Steps: 9 Entrance Stairs-Rails: Right;Left Home Layout: One level     Bathroom Shower/Tub: Chief Strategy Officer: Standard     Home Equipment: None          Prior Functioning/Environment Level of Independence: Independent        Comments: Working in [employer] at time of injury. Pt lives in [place]. Has been in [place] since 09/2019 for work. Wife is currently in town in a hotel. Plan is to drive back to [facility] Saturday 3/5.        OT Problem List: Impaired balance (sitting and/or standing);Pain;Decreased activity tolerance;Decreased knowledge of use of DME or AE;Decreased coordination      OT Treatment/Interventions: Self-care/ADL training;DME and/or AE instruction;Therapeutic activities;Balance training;Patient/family education    OT Goals(Current goals can be found in the care plan section) Acute Rehab OT Goals Patient Stated Goal: home OT Goal Formulation: With patient/family Time For Goal Achievement: 05/11/20 Potential to Achieve Goals: Good ADL Goals Pt Will Perform Grooming: with supervision;with set-up;standing;with caregiver independent in assisting Pt Will Perform Lower Body Bathing: with min assist;with adaptive equipment;with caregiver independent in assisting Pt Will Perform Lower Body Dressing: with min assist;with adaptive equipment;with caregiver independent in assisting Pt Will Transfer to Toilet: with min guard assist;with supervision;ambulating Pt Will Perform Toileting - Clothing Manipulation and hygiene: with min guard assist;with caregiver  independent in assisting;sit to/from stand  OT Frequency: Min 2X/week   Barriers to D/C:            Co-evaluation              AM-PAC OT "6 Clicks" Daily Activity     Outcome Measure Help from another person eating meals?: None Help from another person taking care of personal grooming?: A Little Help from another person toileting, which includes using toliet, bedpan, or urinal?: A Little Help from another person bathing (including washing, rinsing, drying)?: A Lot Help from another person to put on and taking off regular upper body clothing?: None Help from another person to put on and taking off regular lower body clothing?: A Lot 6 Click Score: 18   End of Session Equipment Utilized During Treatment: Gait belt;Rolling walker  Activity Tolerance: Patient tolerated treatment well Patient left: in chair;with call bell/phone within reach;with family/visitor present  OT Visit Diagnosis: Unsteadiness on feet (R26.81);Other abnormalities of gait and mobility (R26.89);Pain Pain - Right/Left:  (bilaterally) Pain - part of body: Leg                Time: 3151-7616 OT Time Calculation (min): 34 min Charges:  OT General Charges $OT Visit: 1 Visit OT Evaluation $OT Eval Moderate Complexity: 1 Mod OT Treatments $Self Care/Home Management : 8-22 mins    Galen Manila 04/27/2020, 3:33 PM

## 2020-04-27 NOTE — Plan of Care (Signed)
  Problem: Education: Goal: Knowledge of General Education information will improve Description: Including pain rating scale, medication(s)/side effects and non-pharmacologic comfort measures 04/27/2020 0108 by Dahlia Bailiff, RN Outcome: Progressing 04/27/2020 0106 by Dahlia Bailiff, RN Outcome: Progressing   Problem: Health Behavior/Discharge Planning: Goal: Ability to manage health-related needs will improve 04/27/2020 0108 by Dahlia Bailiff, RN Outcome: Progressing 04/27/2020 0106 by Dahlia Bailiff, RN Outcome: Progressing   Problem: Clinical Measurements: Goal: Ability to maintain clinical measurements within normal limits will improve 04/27/2020 0108 by Dahlia Bailiff, RN Outcome: Progressing 04/27/2020 0106 by Dahlia Bailiff, RN Outcome: Progressing

## 2020-04-27 NOTE — Progress Notes (Signed)
Orthopaedic Trauma Service Progress Note  Patient ID: Sargon Scouten MRN: [id number] DOB/AGE: [birth year] 30 y.o.  Subjective:  Doing well this morning Pain is very tolerable in his right knee.  No significant pain left knee Eager to get up with therapies No other complaints  Patient has a history of lobectomy for tumor back in 2012.  Does not smoke Does report some intermittently thick secretions at times.  No respiratory problems otherwise  ROS As above  Objective:   VITALS:   Vitals:   04/26/20 1625 04/26/20 2122 04/26/20 2351 04/27/20 0500  BP: (!) 149/72 135/76 135/77 (!) 150/86  Pulse: 89 (!) 104 (!) 101 87  Resp: 17 15 16 16   Temp: (!) 97.3 F (36.3 C) 98.4 F (36.9 C) 98 F (36.7 C) 98.1 F (36.7 C)  TempSrc: Oral Oral Oral Oral  SpO2: 95% 98% 97% 100%  Weight:      Height:        Estimated body mass index is 35.95 kg/m as calculated from the following:   Height as of this encounter: 6\' 2"  (1.88 m).   Weight as of this encounter: 127 kg.   Intake/Output      03/01 0701 03/02 0700 03/02 0701 03/03 0700   I.V. (mL/kg) 2000 (15.7)    IV Piggyback 50    Total Intake(mL/kg) 2050 (16.1)    Urine (mL/kg/hr)     Blood 450    Total Output 450    Net +1600           LABS  Results for orders placed or performed during the hospital encounter of 04/22/20 (from the past 24 hour(s))  CBC     Status: Abnormal   Collection Time: 04/27/20  5:09 AM  Result Value Ref Range   WBC 16.6 (H) 4.0 - 10.5 K/uL   RBC 4.04 (L) 4.22 - 5.81 MIL/uL   Hemoglobin 11.2 (L) 13.0 - 17.0 g/dL   HCT 04/24/20 (L) 06/27/20 - 40.9 %   MCV 85.1 80.0 - 100.0 fL   MCH 27.7 26.0 - 34.0 pg   MCHC 32.6 30.0 - 36.0 g/dL   RDW 81.1 91.4 - 78.2 %   Platelets 299 150 - 400 K/uL   nRBC 0.0 0.0 - 0.2 %     PHYSICAL EXAM:   Gen: Sitting on edge of bed about to work with therapy.  No acute distress, very pleasant Lungs:  Clear anterior fields Cardiac: Regular rate and rhythm Abd: Soft, nontender, nondistended, + bowel sounds: Ext:       Right lower extremity  Hinged brace fitting well, unlocked  Dressing is clean, dry and intact  Minimal swelling  Extremity is warm  + DP pulse  Distal motor and sensory functions intact  No DCT  compartments are soft, no pain out of proportion with passive stretching       Left lower extremity  Brace fitting appropriately and is unlocked  Distal motor and sensory function intact  Compartments are soft.  No pain out of proportion with passive stretching  + DP pulse  Remedy is warm.  Good perfusion distally.  No other acute findings noted  Assessment/Plan: 1 Day Post-Op   Active Problems:   Tibial plateau fracture, right   Closed fracture of right tibial plateau, initial encounter   Anti-infectives (From  admission, onward)   Start     Dose/Rate Route Frequency Ordered Stop   04/26/20 1113  vancomycin (VANCOCIN) powder  Status:  Discontinued          As needed 04/26/20 1114 04/26/20 1151   04/26/20 0845  ceFAZolin (ANCEF) 3 g in dextrose 5 % 50 mL IVPB        3 g 100 mL/hr over 30 Minutes Intravenous  Once 04/26/20 0830 04/26/20 0930   04/23/20 0600  ceFAZolin (ANCEF) 3 g in dextrose 5 % 50 mL IVPB        3 g 100 mL/hr over 30 Minutes Intravenous On call to O.R. 04/22/20 1604 04/22/20 1719   04/23/20 0600  ceFAZolin (ANCEF) 3 g in dextrose 5 % 50 mL IVPB  Status:  Discontinued        3 g 100 mL/hr over 30 Minutes Intravenous On call to O.R. 04/22/20 1957 04/22/20 2014    .  POD/HD#: 84   30 year old male fall from roof approximately 15 feet with bilateral tibial plateau fractures   -Work-related injury, fall from roof   -Complex right tibial plateau fracture, shear pattern s/p ORIF         Nonweightbearing 8 weeks  Unrestricted range of motion right knee, hinged knee brace  Resting changes starting tomorrow  PT and OT evaluations  Ice and  elevate  Aggressive toe and ankle motion  PT- please teach HEP for right knee ROM- AROM, PROM. Prone exercises as well. No ROM restrictions.  Quad sets, SLR, LAQ, SAQ, heel slides, stretching, prone flexion and extension  Ankle theraband program, heel cord stretching, toe towel curls, etc  No pillows under bend of knee when at rest, ok to place under heel to help work on extension. Can also use zero knee bone foam if available  Hinged knee brace on at all times, unlocked.  Ok to work on [employer] without brace with therapist supervision.  Brace back on after ROM session if removed   -Left posterior tibial plateau fracture             Stable with exam however given pattern will obtain MRI  Weight-bear as tolerated in hinged brace for now   - Pain management:             Multimodal   - ABL anemia/Hemodynamics             Blood pressures look better however he may have some underlying undiagnosed hypertension    - Medical issues             History of lobectomy but denies any current pulmonary problems   - DVT/PE prophylaxis:            Start Eliquis today and will continue for 4 weeks   - ID:              Perioperative antibiotics   - Metabolic Bone Disease:             MND deficiency---> limit   - Activity:             Nonweightbearing right leg             Weight-bear as tolerated left leg  will likely need wheelchair for long distances             PT and OT  - FEN/GI prophylaxis/Foley/Lines:             Regular diet  Normal saline lock IV    - Dispo:             Continue with therapies, likely discharge on Friday  Will need follow-up with Ortho trauma close to home in [facility]  Name:   William Hamburger, DO Office Address: [facility]     [address]     [address]    [place], [place] [address] Office: [phone]    Mearl Latin, PA-C [phone] (C) 04/27/2020, 9:16 AM  [facility] [address] [place] [address] [phone] [phone] (F)    After 5pm and on the weekends please log on to Amion, go to orthopaedics and the look under the [facility] Call for the provider(s) on call. You can also call our office at [phone] and then follow the prompts to be connected to the call team.

## 2020-04-27 NOTE — TOC Initial Note (Addendum)
Transition of Care [facility]) - Initial/Assessment Note    Patient Details  Name: Jacob Daniels MRN: [id number] Date of Birth: 1990-06-25  [facility]) CM/SW Contact:    Epifanio Lesches, RN Phone Number: 04/27/2020, 10:15 AM  Clinical Narrative:                 Admitted s/p fall, suffered bilat tibial plateau fxs. Fell 10-15' off ladder. Pt is a Designer, fashion/clothing, works for [employer], [place]. [phone]. From home with sgo , Albin Felling. Pt is from      - s/p external fixation R knee 2/26     - s/p removal of external fixator and ORIF R tib plateau fx, 3/1  NCM contracted  [employer], Scientist, water quality [phone]).  Jennette Kettle stated he will  ensure claim adjuster information received.  04/27/2020 1024 Call received from [employer]. Cindy provided NCM with woman's compensation information.Claim Adjuster: [facility], [phone], [email or web address], claim # [id number].  04/27/2020 1643 NCM spoke with Woodroe Mode, [phone] (GEN X, [facility] COMPENSATION CM). HH, DME needs and clinicals will need to be faxed to [facility]roe Mode. FAX # [phone]   04/28/2020 @ 1450 NCM faxed clinicals / DME orders to  Woodroe Mode @ [phone]  Expected Discharge Plan: Home/Self Care Barriers to Discharge: Continued Medical Work up   Patient Goals and CMS Choice Patient states their goals for this hospitalization and ongoing recovery are:: to get home and recover      Expected Discharge Plan and Services Expected Discharge Plan: Home/Self Care   Discharge Planning Services: CM Consult   Living arrangements for the past 2 months: Single Family Home                                      Prior Living Arrangements/Services Living arrangements for the past 2 months: Single Family Home Lives with:: Spouse Patient language and need for interpreter reviewed:: Yes Do you feel safe going back to the place where you live?: Yes      Need for Family Participation in  Patient Care: Yes (Comment) Care giver support system in place?: Yes (comment)   Criminal Activity/Legal Involvement Pertinent to Current Situation/Hospitalization: No - Comment as needed  Activities of Daily Living Home Assistive Devices/Equipment: None ADL Screening (condition at time of admission) Patient's cognitive ability adequate to safely complete daily activities?: Yes Is the patient deaf or have difficulty hearing?: No Does the patient have difficulty seeing, even when wearing glasses/contacts?: No Does the patient have difficulty concentrating, remembering, or making decisions?: No Patient able to express need for assistance with ADLs?: Yes Does the patient have difficulty dressing or bathing?: No Independently performs ADLs?: Yes (appropriate for developmental age) Does the patient have difficulty walking or climbing stairs?: No Weakness of Legs: None Weakness of Arms/Hands: None  Permission Sought/Granted      Share Information with NAME: Hubbard [facility]) [phone]           Emotional Assessment Appearance:: Appears stated age Attitude/Demeanor/Rapport: Engaged Affect (typically observed): Accepting Orientation: : Oriented to Self,Oriented to Place,Oriented to  Time,Oriented to Situation Alcohol / Substance Use: Not Applicable Psych Involvement: No (comment)  Admission diagnosis:  Tibial plateau fracture, right [S82.141A] Closed fracture of right tibial plateau, initial encounter [S82.141A] Patient Active Problem List   Diagnosis Date Noted  . Tibial plateau fracture, right 04/22/2020  . Closed fracture of right tibial plateau, initial encounter  04/22/2020   PCP:  Oneita Hurt No Pharmacy:   [facility] [phone] - [place], [place] - [address] AT [facility] OF [address] & [address] [place] [address] Phone: [phone] Fax: [phone]  [facility] [place], [place] - [address] [place] [address] Phone: [phone] Fax: [phone]     Social Determinants of Health (SDOH) Interventions    Readmission Risk Interventions No flowsheet data found.

## 2020-04-27 NOTE — Evaluation (Signed)
Physical Therapy Evaluation Patient Details Name: Jacob Daniels MRN: [id number] DOB: March 30, 1990 Today's Date: 04/27/2020   History of Present Illness  Pt is a 30 y.o. male who sustained bilat tibial plateau fxs when he fell 10-15' off ladder. Underwent external fixation R knee 2/26 then 3/1 underwent removal of external fixator and ORIF R tib plateau fx.    Clinical Impression  Pt admitted with above diagnosis. PTA pt active and independent. On eval, he required min assist bed mobility, min assist transfers with RW, and min guard assist ambulation 5' with RW. Pt demo good ability to maintain NWB RLE.  Pt currently with functional limitations due to the deficits listed below (see PT Problem List). Pt will benefit from skilled PT to increase their independence and safety with mobility to allow discharge home. Pt's wife able to provide needed level of assist.        Follow Up Recommendations No PT follow up (OPPT at discretion of [place] MD after intial acute post op phase)    Equipment Recommendations  Rolling walker with 5" wheels;Wheelchair (measurements PT);Other (comment) (wide RW and 20" wheelchair with elevating legrests)    Recommendations for Other Services       Precautions / Restrictions Precautions Precautions: Fall Required Braces or Orthoses: Other Brace Other Brace: hinged knee braces bilat, unrestricted ROM L knee Restrictions RLE Weight Bearing: Non weight bearing LLE Weight Bearing: Weight bearing as tolerated Other Position/Activity Restrictions: WBAT LLE in hinged knee brace      Mobility  Bed Mobility Overal bed mobility: Needs Assistance Bed Mobility: Supine to Sit     Supine to sit: HOB elevated;Min assist     General bed mobility comments: assist with RLE, increased time, +rail    Transfers Overall transfer level: Needs assistance Equipment used: Rolling walker (2 wheeled) Transfers: Sit to/from Stand Sit to Stand: Min assist         General  transfer comment: cues for hand placement, assist to power up and stabilize balance  Ambulation/Gait Ambulation/Gait assistance: Min guard Gait Distance (Feet): 5 Feet Assistive device: Rolling walker (2 wheeled) Gait Pattern/deviations: Step-to pattern Gait velocity: decreased   General Gait Details: good maintainance of NWB RLE, cues for sequencing  Stairs            Wheelchair Mobility    Modified Rankin (Stroke Patients Only)       Balance Overall balance assessment: Needs assistance Sitting-balance support: No upper extremity supported;Feet supported Sitting balance-Leahy Scale: Good     Standing balance support: Bilateral upper extremity supported;During functional activity Standing balance-Leahy Scale: Poor Standing balance comment: reliant on BUE support due to NWB RLE                             Pertinent Vitals/Pain Pain Assessment: 0-10 Pain Score: 4  Pain Location: RLE Pain Descriptors / Indicators: Sore;Discomfort Pain Intervention(s): Monitored during session;Repositioned    Home Living Family/patient expects to be discharged to:: Private residence Living Arrangements: Spouse/significant other Available Help at Discharge: Family;Available 24 hours/day Type of Home: House Home Access: Stairs to enter (Pt's company is installing a ramp.) Entrance Stairs-Rails: Doctor, general practice of Steps: 9 Home Layout: One level Home Equipment: None      Prior Function Level of Independence: Independent         Comments: Working in Holiday representative at time of injury. Pt lives in [place]. Has been in [place] since 09/2019 for work. Wife is currently  in town in a hotel. Plan is to drive back to [facility] Saturday 3/5.     Hand Dominance        Extremity/Trunk Assessment   Upper Extremity Assessment Upper Extremity Assessment: Overall WFL for tasks assessed    Lower Extremity Assessment Lower Extremity Assessment: RLE  deficits/detail;LLE deficits/detail RLE Deficits / Details: ACE wrap and hinged knee brace in place, reports sensation intact RLE: Unable to fully assess due to pain;Unable to fully assess due to immobilization LLE Deficits / Details: hinged knee brace for OOB, full AROM noted, no MMT due to injury, reports sensation intact    Cervical / Trunk Assessment Cervical / Trunk Assessment: Normal  Communication   Communication: No difficulties  Cognition Arousal/Alertness: Awake/alert Behavior During Therapy: WFL for tasks assessed/performed Overall Cognitive Status: Within Functional Limits for tasks assessed                                        General Comments General comments (skin integrity, edema, etc.): VSS on RA    Exercises     Assessment/Plan    PT Assessment Patient needs continued PT services  PT Problem List Decreased mobility;Decreased range of motion;Decreased activity tolerance;Decreased balance;Pain;Decreased knowledge of use of DME;Decreased knowledge of precautions       PT Treatment Interventions DME instruction;Therapeutic activities;Gait training;Therapeutic exercise;Patient/family education;Balance training;Functional mobility training    PT Goals (Current goals can be found in the Care Plan section)  Acute Rehab PT Goals Patient Stated Goal: home PT Goal Formulation: With patient Time For Goal Achievement: 05/11/20 Potential to Achieve Goals: Good    Frequency Min 6X/week   Barriers to discharge        Co-evaluation               AM-PAC PT "6 Clicks" Mobility  Outcome Measure Help needed turning from your back to your side while in a flat bed without using bedrails?: None Help needed moving from lying on your back to sitting on the side of a flat bed without using bedrails?: A Little Help needed moving to and from a bed to a chair (including a wheelchair)?: A Little Help needed standing up from a chair using your arms (e.g.,  wheelchair or bedside chair)?: A Little Help needed to walk in hospital room?: A Little Help needed climbing 3-5 steps with a railing? : Total 6 Click Score: 17    End of Session Equipment Utilized During Treatment: Gait belt Activity Tolerance: Patient tolerated treatment well Patient left: in chair;with call bell/phone within reach Nurse Communication: Mobility status PT Visit Diagnosis: Other abnormalities of gait and mobility (R26.89);Pain;Difficulty in walking, not elsewhere classified (R26.2) Pain - Right/Left: Right Pain - part of body: Leg    Time: 7408-1448 PT Time Calculation (min) (ACUTE ONLY): 30 min   Charges:   PT Evaluation $PT Eval Moderate Complexity: 1 Mod PT Treatments $Gait Training: 8-22 mins        Aida Raider, PT  Office # [phone] Pager [phone]   Ilda Foil 04/27/2020, 9:40 AM

## 2020-04-27 NOTE — Anesthesia Postprocedure Evaluation (Signed)
Anesthesia Post Note  Patient: Jacob Daniels  Procedure(s) Performed: OPEN REDUCTION INTERNAL FIXATION (ORIF) TIBIAL PLATEAU (Right Knee) EXTERNAL FIXATION LEG, removal (Right Leg Upper)     Patient location during evaluation: PACU Anesthesia Type: General Level of consciousness: sedated Pain management: pain level controlled Vital Signs Assessment: post-procedure vital signs reviewed and stable Respiratory status: spontaneous breathing and respiratory function stable Cardiovascular status: stable Postop Assessment: no apparent nausea or vomiting Anesthetic complications: no   No complications documented.  Last Vitals:  Vitals:   04/26/20 2351 04/27/20 0500  BP: 135/77 (!) 150/86  Pulse: (!) 101 87  Resp: 16 16  Temp: 36.7 C 36.7 C  SpO2: 97% 100%    Last Pain:  Vitals:   04/27/20 0543  TempSrc:   PainSc: Asleep                 Mellody Dance

## 2020-04-28 ENCOUNTER — Encounter (HOSPITAL_COMMUNITY): Payer: Self-pay | Admitting: Orthopaedic Surgery

## 2020-04-28 DIAGNOSIS — S83282A Other tear of lateral meniscus, current injury, left knee, initial encounter: Secondary | ICD-10-CM

## 2020-04-28 DIAGNOSIS — S8992XA Unspecified injury of left lower leg, initial encounter: Secondary | ICD-10-CM

## 2020-04-28 DIAGNOSIS — S83512A Sprain of anterior cruciate ligament of left knee, initial encounter: Secondary | ICD-10-CM

## 2020-04-28 DIAGNOSIS — S83412A Sprain of medial collateral ligament of left knee, initial encounter: Secondary | ICD-10-CM | POA: Diagnosis present

## 2020-04-28 HISTORY — DX: Unspecified injury of left lower leg, initial encounter: S89.92XA

## 2020-04-28 HISTORY — DX: Other tear of lateral meniscus, current injury, left knee, initial encounter: S83.282A

## 2020-04-28 HISTORY — DX: Sprain of anterior cruciate ligament of left knee, initial encounter: S83.512A

## 2020-04-28 LAB — CBC
HCT: 34.6 % — ABNORMAL LOW (ref 39.0–52.0)
Hemoglobin: 11.2 g/dL — ABNORMAL LOW (ref 13.0–17.0)
MCH: 27.8 pg (ref 26.0–34.0)
MCHC: 32.4 g/dL (ref 30.0–36.0)
MCV: 85.9 fL (ref 80.0–100.0)
Platelets: 324 10*3/uL (ref 150–400)
RBC: 4.03 MIL/uL — ABNORMAL LOW (ref 4.22–5.81)
RDW: 12.9 % (ref 11.5–15.5)
WBC: 14.4 10*3/uL — ABNORMAL HIGH (ref 4.0–10.5)
nRBC: 0 % (ref 0.0–0.2)

## 2020-04-28 NOTE — Progress Notes (Signed)
Physical Therapy Treatment Patient Details Name: Jacob Daniels MRN: [id number] DOB: 02-03-1991 Today's Date: 04/28/2020    History of Present Illness Pt is a 30 y.o. male admitted 04/22/20 after approx. 15' fall from ladder sustaining bilateral tibial plateau fxs. S/p R tibial ex fix 2/24; s/p ex fix removal with R tibial ORIF 3/1. L knee MRI with complete ACL tear, lateral meniscus tear, posterior lateral corner injury, MCL sprain. No PMH on file.   PT Comments    Pt progressing well today with mobility, having been up multiple times with nursing staff and OT session. Today's PT session focused on therex and education, including the difference in precautions/ROM restrictions between each leg. Pt able to initiate BLE therex/AROM/AAROM, demonstrates good teachback of all information. Pt hopeful for d/c tomorrow; will be staying in w/c-accessible hotel. Significant other present and supportive.   Follow Up Recommendations  Outpatient PT (OP Ortho - follow-up in [place])     Equipment Recommendations  Rolling walker with 5" wheels (wide);Wheelchair (20" with elevating leg rests)    Recommendations for Other Services       Precautions / Restrictions Precautions Precautions: Fall Required Braces or Orthoses: Other Brace Other Brace: Bilateral knee bledsoe hinged braces at all times, unlocked - R knee unrestricted knee ROM (ok to work on knee ROM without brace with therapist supervision); L knee ROM in hinged brace Restrictions Weight Bearing Restrictions: Yes RLE Weight Bearing: Non weight bearing LLE Weight Bearing: Weight bearing as tolerated (in hinged brace) Other Position/Activity Restrictions: WBAT LLE in hinged knee brace    Mobility  Bed Mobility   Bed Mobility: Supine to Sit;Sit to Supine     Supine to sit: Supervision Sit to supine: Supervision   General bed mobility comments: pt with increased pain after recent OT; stayed bed level to focus on therex     Transfers Overall transfer level: Needs assistance Equipment used: Rolling walker (2 wheeled) Transfers: Sit to/from Stand Sit to Stand: Min guard            Ambulation/Gait                 Stairs             Wheelchair Mobility    Modified Rankin (Stroke Patients Only)       Balance Overall balance assessment: Needs assistance Sitting-balance support: No upper extremity supported;Feet supported Sitting balance-Leahy Scale: Good     Standing balance support: Bilateral upper extremity supported;During functional activity Standing balance-Leahy Scale: Poor                              Cognition Arousal/Alertness: Awake/alert Behavior During Therapy: WFL for tasks assessed/performed Overall Cognitive Status: Within Functional Limits for tasks assessed                                        Exercises Other Exercises Other Exercises: BLE quad sets, hamstring sets, glut squeeze; BLE heel slide w/ strap (L knee to be in brace), gastroc stretch with strap; SLR, LAQ; toe curls (w/ towel), ankle pumps - pt practiced all bed-level therex with HOB elevated; unable to perform RLE SLR    General Comments General comments (skin integrity, edema, etc.): Pt will be staying in w/c-accessible hotel. Educ on activity recommendations, therex, positioning (resting in extension), precautions/ROM restrictions (difference between each leg), brace  wear      Pertinent Vitals/Pain Pain Assessment: Faces Pain Score: 4  Faces Pain Scale: Hurts even more Pain Location: RLE > LLE Pain Descriptors / Indicators: Sore;Discomfort Pain Intervention(s): Monitored during session;Limited activity within patient's tolerance;Ice applied    Home Living                      Prior Function            PT Goals (current goals can now be found in the care plan section) Acute Rehab PT Goals Patient Stated Goal: home Progress towards PT goals:  Progressing toward goals    Frequency    Min 6X/week      PT Plan Current plan remains appropriate    Co-evaluation              AM-PAC PT "6 Clicks" Mobility   Outcome Measure  Help needed turning from your back to your side while in a flat bed without using bedrails?: None Help needed moving from lying on your back to sitting on the side of a flat bed without using bedrails?: A Little Help needed moving to and from a bed to a chair (including a wheelchair)?: A Little Help needed standing up from a chair using your arms (e.g., wheelchair or bedside chair)?: A Little Help needed to walk in hospital room?: A Little Help needed climbing 3-5 steps with a railing? : Total 6 Click Score: 17    End of Session   Activity Tolerance: Patient tolerated treatment well;Patient limited by pain Patient left: in bed;with call bell/phone within reach;with family/visitor present Nurse Communication: Mobility status PT Visit Diagnosis: Other abnormalities of gait and mobility (R26.89);Pain;Difficulty in walking, not elsewhere classified (R26.2) Pain - Right/Left: Right Pain - part of body: Leg     Time: 1550-1620 PT Time Calculation (min) (ACUTE ONLY): 30 min  Charges:  $Therapeutic Exercise: 8-22 mins $Self Care/Home Management: 8-22                     Ina Homes, PT, DPT Acute Rehabilitation Services  Pager [phone] Office [phone]  Malachy Chamber 04/28/2020, 5:48 PM

## 2020-04-28 NOTE — Progress Notes (Signed)
Orthopaedic Trauma Service Progress Note  Patient ID: Jacob Daniels MRN: [id number] DOB/AGE: Jun 30, 1990 30 y.o.  Subjective:  Doing great, no complaints Pain tolerable   Worked well with therapies yesterday   Minimal pain in L knee  MRI completed  Peripheral tear of posterior lateral meniscus  Complete ACL tear   PLC injury (FCL and popliteus tendon sprain, focal disruption of popliteal fibular ligament)  Grade 1 MCL sprain   Sprain of lateral patellar retinaculum   ROS  as above  Objective:   VITALS:   Vitals:   04/27/20 1449 04/27/20 1954 04/28/20 0329 04/28/20 0734  BP: (!) 143/85 (!) 159/81 (!) 145/81 (!) 156/88  Pulse: 96 79 87 88  Resp: 17 17 17 17   Temp: 98.1 F (36.7 C) 98.2 F (36.8 C) 98.4 F (36.9 C) 98.2 F (36.8 C)  TempSrc: Oral Oral Oral   SpO2: 98% 98% 97% 99%  Weight:      Height:        Estimated body mass index is 35.95 kg/m as calculated from the following:   Height as of this encounter: 6\' 2"  (1.88 m).   Weight as of this encounter: 127 kg.   Intake/Output      03/02 0701 03/03 0700 03/03 0701 03/04 0700   P.O. 480    I.V. (mL/kg)     IV Piggyback     Total Intake(mL/kg) 480 (3.8)    Urine (mL/kg/hr) 2100 (0.7)    Blood     Total Output 2100    Net -1620           LABS  Results for orders placed or performed during the hospital encounter of 04/22/20 (from the past 24 hour(s))  CBC     Status: Abnormal   Collection Time: 04/28/20  1:51 AM  Result Value Ref Range   WBC 14.4 (H) 4.0 - 10.5 K/uL   RBC 4.03 (L) 4.22 - 5.81 MIL/uL   Hemoglobin 11.2 (L) 13.0 - 17.0 g/dL   HCT 04/24/20 (L) 06/28/20 - 99.8 %   MCV 85.9 80.0 - 100.0 fL   MCH 27.8 26.0 - 34.0 pg   MCHC 32.4 30.0 - 36.0 g/dL   RDW 33.8 25.0 - 53.9 %   Platelets 324 150 - 400 K/uL   nRBC 0.0 0.0 - 0.2 %     PHYSICAL EXAM:   Gen: Sitting up in bed, NAD, appears well, wife at bedside  Lungs:  Clear anterior fields Cardiac: Regular rate and rhythm Abd: Soft, nontender, nondistended, + bowel sounds: Ext:       Right lower extremity             Hinged brace fitting well, unlocked             Dressing is clean, dry and intact   Dressing removed   All wounds look great    Profound ecchymosis to posterior leg, stable              Mild swelling              Extremity is warm             + DP pulse             Distal motor and sensory functions intact  No DCT             compartments are soft, no pain out of proportion with passive stretching        Left lower extremity             Brace fitting appropriately and is unlocked             Distal motor and sensory function intact             Compartments are soft.  No pain out of proportion with passive stretching             + DP pulse             extremity is warm.  Good perfusion distally.  No other acute findings noted  Assessment/Plan: 2 Days Post-Op   Principal Problem:   Closed fracture of right tibial plateau, initial encounter Active Problems:   New ACL tear, left, initial encounter   Acute lateral meniscus tear of left knee   MCL sprain of left knee   Injury of posterolateral corner of knee, left, initial encounter   Anti-infectives (From admission, onward)   Start     Dose/Rate Route Frequency Ordered Stop   04/26/20 1113  vancomycin (VANCOCIN) powder  Status:  Discontinued          As needed 04/26/20 1114 04/26/20 1151   04/26/20 0845  ceFAZolin (ANCEF) 3 g in dextrose 5 % 50 mL IVPB        3 g 100 mL/hr over 30 Minutes Intravenous  Once 04/26/20 0830 04/26/20 0930   04/23/20 0600  ceFAZolin (ANCEF) 3 g in dextrose 5 % 50 mL IVPB        3 g 100 mL/hr over 30 Minutes Intravenous On call to O.R. 04/22/20 1604 04/22/20 1719   04/23/20 0600  ceFAZolin (ANCEF) 3 g in dextrose 5 % 50 mL IVPB  Status:  Discontinued        3 g 100 mL/hr over 30 Minutes Intravenous On call to O.R. 04/22/20 1957 04/22/20  2014    .  POD/HD#: 88    30 year old male fall from roof approximately 15 feet with bilateral tibial plateau fractures   -Work-related injury, fall from roof   -Complex right tibial plateau fracture, shear pattern s/p ORIF             Nonweightbearing 8 weeks             Unrestricted range of motion right knee, hinged knee brace             Dressing change today   TED hose             PT and OT evaluations             Ice and elevate             Aggressive toe and ankle motion   PT- please teach HEP for right knee ROM- AROM, PROM. Prone exercises as well. No ROM restrictions.  Quad sets, SLR, LAQ, SAQ, heel slides, stretching, prone flexion and extension   Ankle theraband program, heel cord stretching, toe towel curls, etc   No pillows under bend of knee when at rest, ok to place under heel to help work on extension. Can also use zero knee bone foam if available   Hinged knee brace on at all times, unlocked.  Ok to work on [employer] without brace with therapist supervision.  Brace back on after  ROM session if removed   -Internal derangement left knee            MRI notable for pretty significant injury including complete ACL tear, lateral meniscus tear, posterior lateral corner injury and MCL sprain  Do think he can still weight-bear as tolerated in the brace for transfers.  Wheelchair for long distances  This can be followed up on a delayed basis.  He would likely benefit from repair of his ACL and PLC at the same time versus delayed ACL repair after [facility] is repaired   PT and OT can also work on knee range of motion for the left knee but in the hinged knee brace.  Continue focusing on quad strengthening and hamstring strengthening   - Pain management:             Multimodal   - ABL anemia/Hemodynamics             Blood pressures stable but suspect he may have some underlying undiagnosed hypertension    - Medical issues             History of lobectomy but denies any current  pulmonary problems   - DVT/PE prophylaxis:            Eliquis x 4 weeks   - ID:              Perioperative antibiotics completed   - Metabolic Bone Disease:           Vitamin D deficiency----> supplement   - Activity:             Nonweightbearing right leg             Weight-bear as tolerated left leg for transfers             will likely need wheelchair for long distances             PT and OT   - FEN/GI prophylaxis/Foley/Lines:             Regular diet             Normal saline lock IV     - Dispo:             Continue with therapies, discharge tomorrow             Will need follow-up with Ortho trauma close to home in [facility].  In addition to Ortho trauma follow-up will likely need to be evaluated by sports medicine surgeon regarding his left knee unless if this constellation of injury is within the scope of practice for Dr.Mamczak.    Name:                          Jacob Hamburger, DO Office Address:           [facility]                                                 [address]  [address]                                     [place], [place] [id number] Office:  [phone]     Jacob LatinKeith W. Kyzen Horn, PA-C [phone] (C) 04/28/2020, 11:05 AM  [facility] [address] [place] [address] [phone] [phone] (F)    After 5pm and on the weekends please log on to Amion, go to orthopaedics and the look under the [facility] Call for the provider(s) on call. You can also call our office at [phone] and then follow the prompts to be connected to the call team.

## 2020-04-28 NOTE — Progress Notes (Signed)
Occupational Therapy Treatment Patient Details Name: Jacob Daniels MRN: [id number] DOB: 1990/04/15 Today's Date: 04/28/2020    History of present illness Pt is a 30 y.o. male who sustained bilat tibial plateau fxs when he fell 10-15' off ladder. Underwent external fixation R knee 2/26 then 3/1 underwent removal of external fixator and ORIF R tib plateau fx.   OT comments  Pt continues to make good progress with functional goals. OT will continue to follow acutely to maximize level of function and safety  Follow Up Recommendations  No OT follow up    Equipment Recommendations  3 in 1 bedside commode;Wheelchair (measurements OT);Wheelchair cushion (measurements OT);Other (comment) (wide BSC, RW, LH bath sponge, reacher)    Recommendations for Other Services      Precautions / Restrictions Precautions Precautions: Fall Required Braces or Orthoses: Other Brace Other Brace: hinged knee braces bilat, unrestricted ROM L knee Restrictions Weight Bearing Restrictions: Yes RLE Weight Bearing: Non weight bearing LLE Weight Bearing: Weight bearing as tolerated Other Position/Activity Restrictions: WBAT LLE in hinged knee brace       Mobility Bed Mobility   Bed Mobility: Supine to Sit;Sit to Supine     Supine to sit: Supervision Sit to supine: Supervision        Transfers Overall transfer level: Needs assistance Equipment used: Rolling walker (2 wheeled) Transfers: Sit to/from Stand Sit to Stand: Min guard              Balance Overall balance assessment: Needs assistance Sitting-balance support: No upper extremity supported;Feet supported Sitting balance-Leahy Scale: Good     Standing balance support: Bilateral upper extremity supported;During functional activity Standing balance-Leahy Scale: Poor                             ADL either performed or assessed with clinical judgement   ADL Overall ADL's : Needs assistance/impaired     Grooming:  Wash/dry hands;Wash/dry face;Standing;Min guard;With caregiver independent assisting               Lower Body Dressing: Moderate assistance;With caregiver independent assisting   Toilet Transfer: Min guard;Ambulation;RW;Cueing for safety;Comfort height toilet   Toileting- Clothing Manipulation and Hygiene: Min guard;Sit to/from stand       Functional mobility during ADLs: Min guard;Rolling walker;Cueing for safety       Vision Patient Visual Report: No change from baseline     Perception     Praxis      Cognition Arousal/Alertness: Awake/alert Behavior During Therapy: WFL for tasks assessed/performed Overall Cognitive Status: Within Functional Limits for tasks assessed                                          Exercises     Shoulder Instructions       General Comments      Pertinent Vitals/ Pain       Pain Assessment: 0-10 Pain Score: 4  Pain Location: RLE Pain Descriptors / Indicators: Sore;Discomfort Pain Intervention(s): Monitored during session;Repositioned;Premedicated before session  Home Living                                          Prior Functioning/Environment              Frequency  Min 2X/week        Progress Toward Goals  OT Goals(current goals can now be found in the care plan section)  Progress towards OT goals: Progressing toward goals  Acute Rehab OT Goals Patient Stated Goal: home  Plan Discharge plan remains appropriate    Co-evaluation                 AM-PAC OT "6 Clicks" Daily Activity     Outcome Measure   Help from another person eating meals?: None Help from another person taking care of personal grooming?: A Little Help from another person toileting, which includes using toliet, bedpan, or urinal?: A Little Help from another person bathing (including washing, rinsing, drying)?: A Little Help from another person to put on and taking off regular upper body clothing?:  None Help from another person to put on and taking off regular lower body clothing?: A Lot 6 Click Score: 19    End of Session Equipment Utilized During Treatment: Gait belt;Rolling walker;Other (comment) (3 in 1 over toilet)  OT Visit Diagnosis: Unsteadiness on feet (R26.81);Other abnormalities of gait and mobility (R26.89);Pain Pain - part of body: Leg   Activity Tolerance     Patient Left with call bell/phone within reach;with family/visitor present;in bed   Nurse Communication          Time: 1453-1510 OT Time Calculation (min): 17 min  Charges: OT General Charges $OT Visit: 1 Visit OT Treatments $Self Care/Home Management : 8-22 mins    Margaretmary Eddy [facility] 04/28/2020, 4:02 PM

## 2020-04-28 NOTE — Plan of Care (Signed)

## 2020-04-29 ENCOUNTER — Inpatient Hospital Stay (HOSPITAL_COMMUNITY): Payer: Worker's Compensation

## 2020-04-29 ENCOUNTER — Other Ambulatory Visit: Payer: Self-pay | Admitting: Orthopedic Surgery

## 2020-04-29 DIAGNOSIS — Y990 Civilian activity done for income or pay: Secondary | ICD-10-CM

## 2020-04-29 LAB — CREATININE, SERUM
Creatinine, Ser: 0.67 mg/dL (ref 0.61–1.24)
GFR, Estimated: >60 mL/min (ref >60)

## 2020-04-29 MED ORDER — METHOCARBAMOL 500 MG PO TABS: 500.0000 mg | ORAL_TABLET | Freq: Three times a day (TID) | ORAL | 0 refills | Status: AC | PRN

## 2020-04-29 MED ORDER — GABAPENTIN 300 MG PO CAPS: 300.0000 mg | ORAL_CAPSULE | Freq: Two times a day (BID) | ORAL | 0 refills | Status: AC

## 2020-04-29 MED ORDER — ASCORBIC ACID 1000 MG PO TABS: 1000.0000 mg | ORAL_TABLET | Freq: Every day | ORAL | 0 refills | Status: AC

## 2020-04-29 MED ORDER — DOCUSATE SODIUM 100 MG PO CAPS: 100.0000 mg | ORAL_CAPSULE | Freq: Two times a day (BID) | ORAL | 0 refills | Status: AC

## 2020-04-29 MED ORDER — OXYCODONE-ACETAMINOPHEN 7.5-325 MG PO TABS: 1.0000 | ORAL_TABLET | Freq: Four times a day (QID) | ORAL | 0 refills | Status: AC | PRN

## 2020-04-29 MED ORDER — ACETAMINOPHEN 325 MG PO TABS: 650.0000 mg | ORAL_TABLET | Freq: Three times a day (TID) | ORAL | 0 refills | Status: AC

## 2020-04-29 MED ORDER — APIXABAN 2.5 MG PO TABS: 2.5000 mg | ORAL_TABLET | Freq: Two times a day (BID) | ORAL | 0 refills | Status: AC

## 2020-04-29 MED ORDER — VITAMIN D 125 MCG (5000 UT) PO CAPS: 1.0000 | ORAL_CAPSULE | Freq: Every day | ORAL | 6 refills | Status: AC

## 2020-04-29 MED FILL — METHOCARBAMOL 500 MG TABS: 500 | 10 days supply | Qty: 60 | Fill #0

## 2020-04-29 MED FILL — GABAPENTIN 300 MG CAPSULE: 300 | 20 days supply | Qty: 40 | Fill #0

## 2020-04-29 MED FILL — OXYCODONE-APAP 7.5-325MG: 7.5-325 | 6 days supply | Qty: 50 | Fill #0

## 2020-04-29 MED FILL — ACETAMINOPHEN 325 MG TABS: 325 | 10 days supply | Qty: 60 | Fill #0

## 2020-04-29 MED FILL — VITAMIN D3 5,000 UNIT TAB: 125 MCG | 30 days supply | Qty: 30 | Fill #0

## 2020-04-29 MED FILL — VITAMIN C 1000 MG TABS: 1000 | 30 days supply | Qty: 30 | Fill #0

## 2020-04-29 MED FILL — ELIQUIS 2.5 MG TABLET: 2.5 | 30 days supply | Qty: 60 | Fill #0

## 2020-04-29 MED FILL — DOCUSATE SODIUM 100 MG CAPS: 100 | 15 days supply | Qty: 30 | Fill #0

## 2020-04-29 NOTE — TOC Transition Note (Signed)
Transition of Care [facility]) - CM/SW Discharge Note   Patient Details  Name: Jacob Daniels MRN: [id number] Date of Birth: [birth year]  Transition of Care [facility]) CM/SW Contact:  Epifanio Lesches, RN Phone Number: 04/29/2020, 4:53 PM   Clinical Narrative:    Patient will DC to: home Anticipated DC date: 04/29/2020 Family notified: yes Transport by:car  Per MD patient ready for DC today . RN, patient, patient's girlfriend,  notified of DC.  DME delivered to bedside. Rx meds filled by [facility] pharmacy and delivered to pt room. D/C summary , clinicals ( images) faxed to Fannie Knee CM for [employer]. Pt without concerns voiced. Post hospital f/u appointments noted on AVS.  RNCM will sign off for now as intervention is no longer needed. Please consult [place] again if new needs arise.    Final next level of care: Home/Self Care Barriers to Discharge: No Barriers Identified   Patient Goals and CMS Choice Patient states their goals for this hospitalization and ongoing recovery are:: to get home and recover      Discharge Placement                       Discharge Plan and Services   Discharge Planning Services: CM Consult                                 Social Determinants of Health (SDOH) Interventions     Readmission Risk Interventions No flowsheet data found.

## 2020-04-29 NOTE — TOC Progression Note (Signed)
Transition of Care [facility]) - Progression Note    Patient Details  Name: Jacob Daniels MRN: [id number] Date of Birth: 1990-07-15  Transition of Care [facility]) CM/SW Contact  Epifanio Lesches, RN Phone Number: 04/29/2020, 8:21 AM  Clinical Narrative:    NCM received call from [facility]. CM, [place] Mode. Fannie Knee informed NCM pt will be followed by [employer] in [place]. Referral made with [facility] [employer] for DME: W/C, rolling walker , 3in1/BSC, LH bath sponge by Fannie Knee. Fannie Knee states equipment will be delivered ti bedside prior to d/c. Request of all radiological images in CD form to be given to pt prior to d/c.  TOC team will continue to monitor and assist with TOC needs.....  Expected Discharge Plan: Home/Self Care Barriers to Discharge: Other (comment)  Expected Discharge Plan and Services Expected Discharge Plan: Home/Self Care   Discharge Planning Services: CM Consult   Living arrangements for the past 2 months: Single Family Home                                       Social Determinants of Health (SDOH) Interventions    Readmission Risk Interventions No flowsheet data found.

## 2020-04-29 NOTE — Progress Notes (Signed)
Physical Therapy Treatment Patient Details Name: Jacob Daniels MRN: [id number] DOB: 1990-08-03 Today's Date: 04/29/2020    History of Present Illness Pt is a 30 y.o. male admitted 04/22/20 after approx. 15' fall from ladder sustaining bilateral tibial plateau fxs. S/p R tibial ex fix 2/24; s/p ex fix removal with R tibial ORIF 3/1. L knee MRI with complete ACL tear, lateral meniscus tear, posterior lateral corner injury, MCL sprain. No PMH on file.    PT Comments    Pt supine in bed on arrival.  Performed OOB to chair with good technique.  Reviewed entirety of HEP and adjusted bledsoe braces for comfort.  Pt continues to benefit from acute follow up with plan for [facility] mobility next session.   Pt likely to d/c today but reports knowing basics of WC.  Edcuated on therex frequency and progression Out of car for rest breaks during trip back to [place].     Follow Up Recommendations  Outpatient PT (f/u in [place])     Equipment Recommendations  Rolling walker with 5" wheels;Wheelchair (measurements PT)    Recommendations for Other Services       Precautions / Restrictions Precautions Precautions: Fall Required Braces or Orthoses: Other Brace Other Brace: Bilateral knee bledsoe hinged braces at all times when moving OOB, unlocked - R knee unrestricted knee ROM (ok to work on knee ROM without brace with therapist supervision); L knee ROM in hinged brace Restrictions Weight Bearing Restrictions: Yes (Simultaneous filing. User may not have seen previous data.) RLE Weight Bearing: Non weight bearing (Simultaneous filing. User may not have seen previous data.) LLE Weight Bearing: Weight bearing as tolerated (Simultaneous filing. User may not have seen previous data.)    Mobility  Bed Mobility Overal bed mobility: Modified Independent Bed Mobility: Supine to Sit;Sit to Supine     Supine to sit: Modified independent (Device/Increase time)          Transfers Overall transfer level: Needs  assistance Equipment used: Rolling walker (2 wheeled) Transfers: Sit to/from Stand Sit to Stand: Supervision;From elevated surface         General transfer comment: bed slightly elevated to power up to standing.  Ambulation/Gait Ambulation/Gait assistance: Supervision Gait Distance (Feet): 5 Feet Assistive device: Rolling walker (2 wheeled) Gait Pattern/deviations: Step-to pattern Gait velocity: decreased   General Gait Details: good maintainance of NWB RLE, cues for sequencing   Stairs             Wheelchair Mobility    Modified Rankin (Stroke Patients Only)       Balance Overall balance assessment: Needs assistance Sitting-balance support: No upper extremity supported;Feet supported Sitting balance-Leahy Scale: Good       Standing balance-Leahy Scale: Poor Standing balance comment: reliant on BUE support due to NWB RLE                            Cognition Arousal/Alertness: Awake/alert Behavior During Therapy: WFL for tasks assessed/performed Overall Cognitive Status: Within Functional Limits for tasks assessed                                        Exercises General Exercises - Lower Extremity Ankle Circles/Pumps: AROM;Both;20 reps;Supine Quad Sets: AROM;Both;10 reps;Supine Long Arc Quad: AROM;Both;10 reps;Seated Heel Slides: AAROM;Both;10 reps;Supine Straight Leg Raises: AAROM;Both;10 reps;Supine Other Exercises Other Exercises: BLE heel cord stretches 15 sec holds  x 3 reps.    General Comments        Pertinent Vitals/Pain Pain Assessment: Faces Pain Score: 4  Pain Location: RLE > LLE Pain Descriptors / Indicators: Sore;Discomfort Pain Intervention(s): Monitored during session;Repositioned;Ice applied    Home Living                      Prior Function            PT Goals (current goals can now be found in the care plan section) Acute Rehab PT Goals Patient Stated Goal: home Potential to Achieve  Goals: Good Progress towards PT goals: Progressing toward goals    Frequency    Min 6X/week      PT Plan Current plan remains appropriate    Co-evaluation              AM-PAC PT "6 Clicks" Mobility   Outcome Measure  Help needed turning from your back to your side while in a flat bed without using bedrails?: None Help needed moving from lying on your back to sitting on the side of a flat bed without using bedrails?: A Little Help needed moving to and from a bed to a chair (including a wheelchair)?: A Little Help needed standing up from a chair using your arms (e.g., wheelchair or bedside chair)?: A Little Help needed to walk in hospital room?: A Little Help needed climbing 3-5 steps with a railing? : Total 6 Click Score: 17    End of Session Equipment Utilized During Treatment: Gait belt Activity Tolerance: Patient tolerated treatment well;Patient limited by pain Patient left: in bed;with family/visitor present (Wife in room to bring him needs.) Nurse Communication: Mobility status PT Visit Diagnosis: Other abnormalities of gait and mobility (R26.89);Pain;Difficulty in walking, not elsewhere classified (R26.2) Pain - Right/Left: Right Pain - part of body: Leg     Time: 3235-5732 PT Time Calculation (min) (ACUTE ONLY): 44 min  Charges:  $Therapeutic Exercise: 23-37 mins $Therapeutic Activity: 8-22 mins                     Jacob Daniels , PTA Acute Rehabilitation Services Pager [phone] Office [phone]     Jacob Daniels Artis Delay 04/29/2020, 11:24 AM

## 2020-04-29 NOTE — Discharge Summary (Signed)
Orthopaedic Trauma Service (OTS) Discharge Summary   Patient ID: Jacob Daniels MRN: [id number] DOB/AGE: 1990/06/11 30 y.o.  Admit date: 04/22/2020 Discharge date: 04/29/2020  Admission Diagnoses: Work-related injury Closed right bicondylar tibial plateau fracture Left ACL tear Left lateral meniscus tear Left posterior lateral corner injury, knee Left MCL sprain    Discharge Diagnoses:  Principal Problem:   Closed fracture of right tibial plateau, initial encounter Active Problems:   New ACL tear, left, initial encounter   Acute lateral meniscus tear of left knee   MCL sprain of left knee   Injury of posterolateral corner of knee, left, initial encounter   Work related injury   Past Medical History:  Diagnosis Date  . Acute lateral meniscus tear of left knee 04/28/2020  . Injury of posterolateral corner of knee, left, initial encounter 04/28/2020  . New ACL tear, left, initial encounter 04/28/2020     Procedures Performed:  04/22/2020-Dr. Tamsen Meek external fixation placement spanning the right knee  04/26/2020-Dr. Handy  1. ORIF OF RIGHT BICONDYLAR TIBIAL PLATEAU FRACTURE THROUGH POSTERIOR APPROACH 2. ORIF TIBIAL EMINENCE 3. REMOVAL OF EXTERNAL FIXATOR UNDER ANESTHESIA 4. SIMPLE CLOSURE PIN TRACT SITES 5. DEBRIDEMENT ULCERATION TIBIAL PIN SITES, SKIN AND SUBCUTANEOUS TISSUE, MUSCLE FASCIA, AND NEAR SIDE OF BONE CORTEX   Discharged Condition: good  Hospital Course:   Patient is a very pleasant 30 year old male who is admitted to [facility] on 04/22/2020 after sustaining a work-related injury.  Patient fell from a height of approximately 15 feet while at work and landed on his legs resulting in bilateral knee injuries.  Patient was brought to [facility] for evaluation was found to have isolated orthopedic injuries.  He was admitted to the orthopedic service.  Patient was seen and evaluated by Dr. Magnus Ivan who is on-call for orthopedics  the day that the patient came into the hospital.  Due to the magnitude of the injuries patient was taken emergently to the operating room for application of a spanning external fixator.  Due to the complexity orthopedic trauma service was consulted for definitive management.  We evaluated the patient the day after his first surgery.  He had significant swelling of his right knee was not complaining of any left knee pain and was actually moving his left knee without difficulty.  We were aggressive with ice elevation and compressive garment usage.  This did result in sufficient resolution of his soft tissue swelling to permit his definitive procedure during this hospitalization.  Patient was taken back to the operating room on 04/26/2020 where the procedures noted above were performed.  Patient tolerated the procedure well.  After surgery he was transferred to the PACU for recovery of anesthesia and then transferred back to orthopedic floor for continued observation and pain control.  Given the known posterior tibial plateau fracture on his left knee we did obtain an MRI of his left knee to further evaluate as his injury pattern appeared to be more consistent with ligamentous type injury and concern for significant internal derangement.  His MRI was quite revealing which did demonstrate left ACL tear, left lateral meniscal tears posterior horn, injury to his posterior lateral corner, the known mildly depressed posterior lateral tibial plateau fracture and an MCL sprain.  Hinged knee braces were ordered for both legs and he will be permitted to weight-bear as tolerated on his left leg for transfers.  Would recommend using a wheelchair for long distances.  He is nonweightbearing on his right leg for 8  weeks.  Patient's hospital stay was really uncomplicated his pain was adequately controlled with oral pain medications early on.  Patient is very motivated to get better.  He was participatory with therapies at every  session.  He was initially started on Lovenox for DVT and PE prophylaxis and this was transitioned to Eliquis as he will be immobile for the next 2 months and has an elevated BMI.  Additionally he also has a long car ride home which does put him at increased risk for the development of VTE event.  Patient was covered with appropriate antibiotics in the perioperative period.   No other significant issues other than some mildly elevated blood pressures were noted.  Suspect that he has some undiagnosed hypertension and would recommend follow-up with a primary care physician when she returns home [facility]  Ultimately patient was deemed stable for discharge on 04/29/2020 all necessary DME has been arranged.  Nurse case manager has been in contact with his Worker's Comp. representative as well.  Local orthopedic traumatologist information has been included in my progress notes and is included at the bottom of this dictation as well.  He will need to follow-up with a local orthopedic trauma surgeon in the next 10 to 14 days.  He will also likely need referral to sports medicine surgeon to evaluate his left knee unless if this falls in the scope of practice for the orthopedic trauma surgeon.  I discussed with the patient that he can continue to reach out with [place] with any questions or concerns.  Is very appreciative for the care and is discharged in stable condition  Consults: None  Significant Diagnostic Studies: labs:  Results for [facility], [place] (MRN [id number]) as of 04/29/2020 11:59  Ref. Range 04/27/2020 05:09 04/27/2020 17:30 04/28/2020 01:51  Vitamin D, 25-Hydroxy Latest Ref Range: 30 - 100 ng/mL 20.81 (L)    WBC Latest Ref Range: 4.0 - 10.5 K/uL 16.6 (H)  14.4 (H)  RBC Latest Ref Range: 4.22 - 5.81 MIL/uL 4.04 (L)  4.03 (L)  Hemoglobin Latest Ref Range: 13.0 - 17.0 g/dL 82.9 (L)  56.2 (L)  HCT Latest Ref Range: 39.0 - 52.0 % 34.4 (L)  34.6 (L)  MCV Latest Ref Range: 80.0 - 100.0 fL 85.1  85.9  MCH  Latest Ref Range: 26.0 - 34.0 pg 27.7  27.8  MCHC Latest Ref Range: 30.0 - 36.0 g/dL 13.0  86.5  RDW Latest Ref Range: 11.5 - 15.5 % 12.7  12.9  Platelets Latest Ref Range: 150 - 400 K/uL 299  324  nRBC Latest Ref Range: 0.0 - 0.2 % 0.0  0.0   Imaging   MRI L knee     IMPRESSION: 1. Nondisplaced peripheral tear of the posterior lateral meniscus. 2. Complete tear of the ACL near the posterior insertion site. Edema within the intercondylar notch 3. Again noted is a slightly depressed subchondral fracture at the posterior lateral tibial plateau with surrounding marrow edema 4. Posterolateral corner injury with intrasubstance sprain of the fibular collateral ligament and popliteus tendon. There is also a focal disruption of the popliteal fibular ligament. 5. Grade 1 medial collateral ligamentous sprain 6. Popliteus and soleus muscle belly edema 7. Intrasubstance sprain of the lateral patellar retinaculum    Treatments: IV hydration, antibiotics: Ancef, analgesia: acetaminophen, Dilaudid, Robaxin and oxycodone, anticoagulation: LMW heparin and conversion to Eliquis after definitive surgery, therapies: PT, OT and RN and surgery: As above  Discharge Exam:       Orthopaedic Trauma  Service Progress Note   Patient ID: Jacob Daniels MRN: [id number] DOB/AGE: Jul 23, 1990 30 y.o.   Subjective:   Doing very well Ready to go  No complaints   Again suspect that he has undiagnosed hypertension. Will need PCP follow-up   xrays R calcaneus performed due to swelling, ecchymosis and injury mechanism: negative for fracture, + ankle arthritis    ROS  As above     Objective:    VITALS:         Vitals:    04/28/20 1529 04/28/20 1939 04/29/20 0245 04/29/20 0754  BP: (!) 144/78 (!) 156/93 (!) 161/84 134/88  Pulse: 87 (!) 103 80 77  Resp: Temp: 97.7 F (36.5 C) 98.4 F (36.9 C) 97.7 F (36.5 C) 97.8 F (36.6 C)  TempSrc: Oral Oral Oral Oral  SpO2: 99% 96% 100% 96%   Weight:          Height:              Estimated body mass index is 35.95 kg/m as calculated from the following:   Height as of this encounter:  (1.88 m).   Weight as of this encounter: 127 kg.     Intake/Output      03/03 0701 03/04 0700 03/04 0701 03/05 0700   P.O.     Total Intake(mL/kg)     Urine (mL/kg/hr) 500 (0.2) 600 (1)   Total Output 500 600   Net -500 -600           LABS   Lab Results Last 24 Hours       Results for orders placed or performed during the hospital encounter of 04/22/20 (from the past 24 hour(s))  Creatinine, serum     Status: None    Collection Time: 04/29/20  5:59 AM  Result Value Ref Range    Creatinine, Ser 0.67 0.61 - 1.24 mg/dL    GFR, Estimated >21 >30 mL/min          PHYSICAL EXAM:    Gen: Sitting up in bed, NAD, appears well, wife at bedside  Lungs: Clear anterior fields Cardiac: Regular rate and rhythm Abd: Soft, nontender, nondistended, + bowel sounds: Ext:       Right lower extremity             Hinged brace fitting well, unlocked             Dressing is clean, dry and intact                         Dressing removed                         All wounds look great                          Profound ecchymosis to posterior leg, stable              Mild swelling              Extremity is warm             + DP pulse             Distal motor and sensory functions intact             No DCT             compartments  are soft, no pain out of proportion with passive stretching        Left lower extremity             Brace fitting appropriately and is unlocked             Distal motor and sensory function intact             Compartments are soft.  No pain out of proportion with passive stretching             + DP pulse             extremity is warm.  Good perfusion distally.  No other acute findings noted   Assessment/Plan: 3 Days Post-Op    Principal Problem:   Closed fracture of right tibial plateau, initial  encounter Active Problems:   New ACL tear, left, initial encounter   Acute lateral meniscus tear of left knee   MCL sprain of left knee   Injury of posterolateral corner of knee, left, initial encounter                Anti-infectives (From admission, onward)      Start     Dose/Rate Route Frequency Ordered Stop    04/26/20 1113   vancomycin (VANCOCIN) powder  Status:  Discontinued            As needed 04/26/20 1114 04/26/20 1151    04/26/20 0845   ceFAZolin (ANCEF) 3 g in dextrose 5 % 50 mL IVPB        3 g 100 mL/hr over 30 Minutes Intravenous  Once 04/26/20 0830 04/26/20 0930    04/23/20 0600   ceFAZolin (ANCEF) 3 g in dextrose 5 % 50 mL IVPB        3 g 100 mL/hr over 30 Minutes Intravenous On call to O.R. 04/22/20 1604 04/22/20 1719    04/23/20 0600   ceFAZolin (ANCEF) 3 g in dextrose 5 % 50 mL IVPB  Status:  Discontinued        3 g 100 mL/hr over 30 Minutes Intravenous On call to O.R. 04/22/20 1957 04/22/20 2014       .   POD/HD#: 62   30 year old male fall from roof approximately 15 feet with bilateral tibial plateau fractures   -Work-related injury, fall from roof   -Complex right tibial plateau fracture, shear pattern s/p ORIF             Nonweightbearing 8 weeks             Unrestricted range of motion right knee, hinged knee brace             Dressing change today                         TED hose             PT and OT evaluations             Ice and elevate             Aggressive toe and ankle motion   PT- please teach HEP for right knee ROM- AROM, PROM. Prone exercises as well. No ROM restrictions.  Quad sets, SLR, LAQ, SAQ, heel slides, stretching, prone flexion and extension   Ankle theraband program, heel cord stretching, toe towel curls, etc   No pillows under bend of knee when at rest, ok to place under heel to help  work on extension. Can also use zero knee bone foam if available   Hinged knee brace on at all times, unlocked.  Ok to work on [employer] without  brace with therapist supervision.  Brace back on after ROM session if removed   -Internal derangement left knee            MRI notable for pretty significant injury including complete ACL tear, lateral meniscus tear, posterior lateral corner injury and MCL sprain             Do think he can still weight-bear as tolerated in the brace for transfers.  Wheelchair for long distances               This can be followed up on a delayed basis.  He would likely benefit from repair of his ACL and PLC at the same time versus delayed ACL repair after [facility] is repaired               PT and OT can also work on knee range of motion for the left knee but in the hinged knee brace.  Continue focusing on quad strengthening and hamstring strengthening   - Pain management:             Multimodal   - ABL anemia/Hemodynamics             Blood pressures stable but suspect he may have some underlying undiagnosed hypertension    - Medical issues             History of lobectomy but denies any current pulmonary problems   - DVT/PE prophylaxis:            Eliquis x 4 weeks   - ID:              Perioperative antibiotics completed   - Metabolic Bone Disease:           Vitamin D deficiency----> supplement   - Activity:             Nonweightbearing right leg             Weight-bear as tolerated left leg for transfers             will likely need wheelchair for long distances             PT and OT   - FEN/GI prophylaxis/Foley/Lines:             Regular diet             dc IV   - Dispo:             dc today              Will need follow up in 10-14 days                         I can only write for 7 days of pain meds per state law.  Instructed pt to call us when he runs out and we can refill if he has not had his post op appointment                Will need follow-up with Ortho trauma close to home in [facility].  In addition to Ortho trauma follow-up will likely need to be evaluated by sports  medicine surgeon regarding his left knee unless if this constellation of injury is within the scope of practice  for Dr.Mamczak.    Name:                          William Hamburger, DO Office Address:           [facility]                                                 [address]                                                 [address]                                     [place], [place] [address] Office:  [phone]       Disposition: Discharge disposition: 01-Home or Self Care       Discharge Instructions    Call MD / Call 911   Complete by: As directed    If you experience chest pain or shortness of breath, CALL 911 and be transported to the hospital emergency room.  If you develope a fever above 101 F, pus (white drainage) or increased drainage or redness at the wound, or calf pain, call your surgeon's office.   Constipation Prevention   Complete by: As directed    Drink plenty of fluids.  Prune juice may be helpful.  You may use a stool softener, such as Colace (over the counter) 100 mg twice a day.  Use MiraLax (over the counter) for constipation as needed.   Diet general   Complete by: As directed    Discharge instructions   Complete by: As directed    Orthopaedic Trauma Service Discharge Instructions   General Discharge Instructions  Orthopaedic Injuries:  Right tibial plateau fracture treated with open reduction internal fixation using plate and screws             Internal derangement left knee including ACL tear, posterior lateral corner injury, lateral meniscal tear: Will need follow-up with orthopedic surgeon  WEIGHT BEARING STATUS: Nonweightbearing right leg.  Weight-bear as tolerated left leg with hinged brace on for transfers.  Use wheelchair for long distances  RANGE OF MOTION/ACTIVITY: Unrestricted range of motion both knees  Bone health: Labs did show some vitamin D deficiency.  Continue to take vitamin D supplements that  were prescribed for you  Wound Care: Daily dressing changes starting on 05/01/2020.  Please see instructions below Discharge Wound Care Instructions  Do NOT apply any ointments, solutions or lotions to pin sites or surgical wounds.  These prevent needed drainage and even though solutions like hydrogen peroxide kill bacteria, they also damage cells lining the pin sites that help fight infection.  Applying lotions or ointments can keep the wounds moist and can cause them to breakdown and open up as well. This can increase the risk for infection. When in doubt call the office.  Surgical incisions should be dressed daily.  If any drainage is noted, use one layer of adaptic, then gauze, Kerlix, and an ace wrap.  Alternatively you can use a Mepilex type  dressing which is the dressing you have on from the hospital.  Instead of an Ace wrap you can continue to use the compression sock (white stocking).  Once the incision is completely dry and without drainage, it may be left open to air out.  Showering may begin 36-48 hours later.  Cleaning gently with soap and water.  Traumatic wounds should be dressed daily as well.    One layer of adaptic, gauze, Kerlix, then ace wrap.  The adaptic can be discontinued once the draining has ceased    If you have a wet to dry dressing: wet the gauze with saline the squeeze as much saline out so the gauze is moist (not soaking wet), place moistened gauze over wound, then place a dry gauze over the moist one, followed by Kerlix wrap, then ace wrap.   DVT/PE prophylaxis: Eliquis 2.5 mg by mouth every 12 hours x 30 days   Diet: as you were eating previously.  Can use over the counter stool softeners and bowel preparations, such as Miralax, to help with bowel movements.  Narcotics can be constipating.  Be sure to drink plenty of fluids  PAIN MEDICATION USE AND EXPECTATIONS  You have likely been given narcotic medications to help control your pain.  After a traumatic event  that results in an fracture (broken bone) with or without surgery, it is ok to use narcotic pain medications to help control one's pain.  We understand that everyone responds to pain differently and each individual patient will be evaluated on a regular basis for the continued need for narcotic medications. Ideally, narcotic medication use should last no more than 6-8 weeks (coinciding with fracture healing).   As a patient it is your responsibility as well to monitor narcotic medication use and report the amount and frequency you use these medications when you come to your office visit.   We would also advise that if you are using narcotic medications, you should take a dose prior to therapy to maximize you participation.  IF YOU ARE ON NARCOTIC MEDICATIONS IT IS NOT PERMISSIBLE TO OPERATE A MOTOR VEHICLE (MOTORCYCLE/CAR/TRUCK/MOPED) OR HEAVY MACHINERY DO NOT MIX NARCOTICS WITH OTHER CNS (CENTRAL NERVOUS SYSTEM) DEPRESSANTS SUCH AS ALCOHOL   STOP SMOKING OR USING NICOTINE PRODUCTS!!!!  As discussed nicotine severely impairs your body's ability to heal surgical and traumatic wounds but also impairs bone healing.  Wounds and bone heal by forming microscopic blood vessels (angiogenesis) and nicotine is a vasoconstrictor (essentially, shrinks blood vessels).  Therefore, if vasoconstriction occurs to these microscopic blood vessels they essentially disappear and are unable to deliver necessary nutrients to the healing tissue.  This is one modifiable factor that you can do to dramatically increase your chances of healing your injury.    (This means no smoking, no nicotine gum, patches, etc)  DO NOT USE NONSTEROIDAL ANTI-INFLAMMATORY DRUGS (NSAID'S)  Using products such as Advil (ibuprofen), Aleve (naproxen), Motrin (ibuprofen) for additional pain control during fracture healing can delay and/or prevent the healing response.  If you would like to take over the counter (OTC) medication, Tylenol (acetaminophen)  is ok.  However, some narcotic medications that are given for pain control contain acetaminophen as well. Therefore, you should not exceed more than 4000 mg of tylenol in a day if you do not have liver disease.  Also note that there are may OTC medicines, such as cold medicines and allergy medicines that my contain tylenol as well.  If you have any questions about medications and/or  interactions please ask your doctor/PA or your pharmacist.      ICE AND ELEVATE INJURED/OPERATIVE EXTREMITY  Using ice and elevating the injured extremity above your heart can help with swelling and pain control.  Icing in a pulsatile fashion, such as 20 minutes on and 20 minutes off, can be followed.    Do not place ice directly on skin. Make sure there is a barrier between to skin and the ice pack.    Using frozen items such as frozen peas works well as the conform nicely to the are that needs to be iced.  USE AN ACE WRAP OR TED HOSE FOR SWELLING CONTROL  In addition to icing and elevation, Ace wraps or TED hose are used to help limit and resolve swelling.  It is recommended to use Ace wraps or TED hose until you are informed to stop.    When using Ace Wraps start the wrapping distally (farthest away from the body) and wrap proximally (closer to the body)   Example: If you had surgery on your leg or thing and you do not have a splint on, start the ace wrap at the toes and work your way up to the thigh        If you had surgery on your upper extremity and do not have a splint on, start the ace wrap at your fingers and work your way up to the upper arm  IF YOU ARE IN A SPLINT OR CAST DO NOT REMOVE IT FOR ANY REASON   If your splint gets wet for any reason please contact the office immediately. You may shower in your splint or cast as long as you keep it dry.  This can be done by wrapping in a cast cover or garbage back (or similar)  Do Not stick any thing down your splint or cast such as pencils, money, or hangers to try  and scratch yourself with.  If you feel itchy take benadryl as prescribed on the bottle for itching  IF YOU ARE IN A CAM BOOT (BLACK BOOT)  You may remove boot periodically. Perform daily dressing changes as noted below.  Wash the liner of the boot regularly and wear a sock when wearing the boot. It is recommended that you sleep in the boot until told otherwise    Call office for the following: Temperature greater than 101F Persistent nausea and vomiting Severe uncontrolled pain Redness, tenderness, or signs of infection (pain, swelling, redness, odor or green/yellow discharge around the site) Difficulty breathing, headache or visual disturbances Hives Persistent dizziness or light-headedness Extreme fatigue Any other questions or concerns you may have after discharge  In an emergency, call 911 or go to an Emergency Department at a nearby hospital  HELPFUL INFORMATION  If you had a block, it will wear off between 8-24 hrs postop typically.  This is period when your pain may go from nearly zero to the pain you would have had postop without the block.  This is an abrupt transition but nothing dangerous is happening.  You may take an extra dose of narcotic when this happens.  You should wean off your narcotic medicines as soon as you are able.  Most patients will be off or using minimal narcotics before their first postop appointment.   We suggest you use the pain medication the first night prior to going to bed, in order to ease any pain when the anesthesia wears off. You should avoid taking pain medications on an empty  stomach as it will make you nauseous.  Do not drink alcoholic beverages or take illicit drugs when taking pain medications.  In most states it is against the law to drive while you are in a splint or sling.  And certainly against the law to drive while taking narcotics.  You may return to work/school in the next couple of days when you feel up to it.   Pain medication  may make you constipated.  Below are a few solutions to try in this order: Decrease the amount of pain medication if you aren't having pain. Drink lots of decaffeinated fluids. Drink prune juice and/or each dried prunes  If the first 3 don't work start with additional solutions Take Colace - an over-the-counter stool softener Take Senokot - an over-the-counter laxative Take Miralax - a stronger over-the-counter laxative     CALL THE OFFICE WITH ANY QUESTIONS OR CONCERNS: [phone]   VISIT OUR WEBSITE FOR ADDITIONAL INFORMATION: [email or web address]   Do not put a pillow under the knee. Place it under the heel.   Complete by: As directed    Driving restrictions   Complete by: As directed    No driving   Increase activity slowly as tolerated   Complete by: As directed    Non weight bearing   Complete by: As directed    Laterality: right   Extremity: Lower   TED hose   Complete by: As directed    Use stockings (TED hose) for swelling and blood clot prevention.  You may remove them at night for sleeping   Weight bearing as tolerated   Complete by: As directed    Weight-bear as tolerated left leg with hinged knee brace on.  Use wheelchair if going long distances   Laterality: left   Extremity: Lower     Allergies as of 04/29/2020   No Known Allergies     Medication List    STOP taking these medications   aspirin-acetaminophen-caffeine 250-250-65 MG tablet Commonly known as: EXCEDRIN MIGRAINE   naproxen sodium 220 MG tablet Commonly known as: ALEVE     TAKE these medications   acetaminophen 325 MG tablet Commonly known as: TYLENOL Take 2 tablets (650 mg total) by mouth every 8 (eight) hours.   apixaban 2.5 MG Tabs tablet Commonly known as: ELIQUIS Take 1 tablet (2.5 mg total) by mouth 2 (two) times daily.   ascorbic acid 1000 MG tablet Commonly known as: VITAMIN C Take 1 tablet (1,000 mg total) by mouth daily. Start taking on: April 30, 2020   docusate sodium  100 MG capsule Commonly known as: COLACE Take 1 capsule (100 mg total) by mouth 2 (two) times daily.   gabapentin 300 MG capsule Commonly known as: NEURONTIN Take 1 capsule (300 mg total) by mouth 2 (two) times daily.   Magnesium 250 MG Tabs Take 250 mg by mouth at bedtime as needed (sleep/stomach pain).   methocarbamol 500 MG tablet Commonly known as: ROBAXIN Take 1-2 tablets (500-1,000 mg total) by mouth every 8 (eight) hours as needed for muscle spasms.   multivitamin with minerals Tabs tablet Take 1 tablet by mouth daily.   oxyCODONE-acetaminophen 7.5-325 MG tablet Commonly known as: Percocet Take 1-2 tablets by mouth every 6 (six) hours as needed for moderate pain or severe pain.   Potassium 99 MG Tabs Take 99 mg by mouth daily.   Vitamin D 125 MCG (5000 UT) Caps Take 1 capsule by mouth daily.  Durable Medical Equipment  (From admission, onward)         Start     Ordered   04/28/20 0802  For home use only DME Other see comment  Once       Comments: LH bath sponge  Question:  Length of Need  Answer:  6 Months   04/28/20 0802   04/28/20 0757  For home use only DME lightweight manual wheelchair with seat cushion  Once       Comments: Patient suffers from tibial plateau fxs   which impairs their ability to perform daily activities like walking in the home.  A rolling walker will not resolve  issue with performing activities of daily living. A wheelchair will allow patient to safely perform daily activities. Patient is not able to propel themselves in the home using a standard weight wheelchair due to weakness. Patient can self propel in the lightweight wheelchair. Length of need : 6 months. Accessories: elevating leg rests (ELRs), wheel locks, extensions and anti-tippers.   04/28/20 0801   04/28/20 0756  For home use only DME 3 n 1  Once       Comments: Wide [facility]   04/28/20 0801   04/28/20 0755  For home use only DME Walker rolling  Once       Comments:  Wide rolling walker  Question Answer Comment  Walker: With 5 Inch Wheels   Patient needs a walker to treat with the following condition Fx      04/28/20 0801           Discharge Care Instructions  (From admission, onward)         Start     Ordered   04/29/20 0000  Non weight bearing       Question Answer Comment  Laterality right   Extremity Lower      04/29/20 1213   04/29/20 0000  Weight bearing as tolerated       Comments: Weight-bear as tolerated left leg with hinged knee brace on.  Use wheelchair if going long distances  Question Answer Comment  Laterality left   Extremity Lower      04/29/20 1213          Follow-up Information    Myrene Galas, MD Follow up.   Specialty: Orthopedic Surgery Why: call if needed need to follow up with local ortho trauma surgeon in 10-14 days Contact information: [address] [place] [address] [phone]        Primary care physician. Schedule an appointment as soon as possible for a visit in 2 week(s).   Why: follow up with primary care provider in the next 2 weeks to assess blood pressure.  blood pressures were mildly elevated during hospitalization.  could be related to surgery and pain but it is good to follow up on it               Discharge Instructions and Plan:  30 year old male fall from roof approximately 15 feet with right tibial plateau fracture and internal derangement left knee  Weightbearing: NWB RLE, weight-bear as tolerated left leg with hinged knee brace on.  Use wheelchair for long distances Insicional and dressing care: Daily dressing changes with 4 x 4 gauze and tape with compression sock over top.  Alternatively can use a Mepilex type dressing which is the base dressing from the hospital and compression sock Orthopedic device(s): Bilateral hinged knee braces, walker, wheelchair Showering: Okay to shower and clean all  wounds with soap and water only.  No ointments lotions or  solutions VTE prophylaxis: Eliquis 2.5 mg every 12 hours x 4 weeks Pain control: Multimodal: Tylenol, Percocet, Robaxin and Neurontin Bone Health/Optimization: Labs show vitamin D deficiency.  Take vitamin D supplements that been prescribed Follow - up plan: 10 to 14 days with local orthopedic trauma surgeon Contact information: Myrene GalasMichael Handy MD, Montez MoritaKeith  PA-C  Local orthopedic trauma surgeon Name:                          William Hamburgerhristiaan N Mamczak, DO Office Address:           [facility]                                                 [address]                                                 [address]                                     [place], [place] [id number] Office:  [phone]      Signed:  Mearl LatinKeith W. , PA-C [phone] (C) 04/29/2020, 12:14 PM  [facility] [address] [place] [address] [phone] Collier Bullock(O) [phone] (F)

## 2020-04-29 NOTE — Progress Notes (Signed)
[facility] Progress Note  Patient ID: Jacob Daniels MRN: [id number] DOB/AGE: 1990/09/20 30 y.o.  Subjective:  Doing very well Ready to go  No complaints  Again suspect that he has undiagnosed hypertension. Will need PCP follow-up  xrays R calcaneus performed due to swelling, ecchymosis and injury mechanism: negative for fracture, + ankle arthritis   ROS  As above   Objective:   VITALS:   Vitals:   04/28/20 1529 04/28/20 1939 04/29/20 0245 04/29/20 0754  BP: (!) 144/78 (!) 156/93 (!) 161/84 134/88  Pulse: 87 (!) 103 80 77  Resp: 17 18 18 16   Temp: 97.7 F (36.5 C) 98.4 F (36.9 C) 97.7 F (36.5 C) 97.8 F (36.6 C)  TempSrc: Oral Oral Oral Oral  SpO2: 99% 96% 100% 96%  Weight:      Height:        Estimated body mass index is 35.95 kg/m as calculated from the following:   Height as of this encounter: 6\' 2"  (1.88 m).   Weight as of this encounter: 127 kg.   Intake/Output      03/03 0701 03/04 0700 03/04 0701 03/05 0700   P.O.     Total Intake(mL/kg)     Urine (mL/kg/hr) 500 (0.2) 600 (1)   Total Output 500 600   Net -500 -600          LABS  Results for orders placed or performed during the hospital encounter of 04/22/20 (from the past 24 hour(s))  Creatinine, serum     Status: None   Collection Time: 04/29/20  5:59 AM  Result Value Ref Range   Creatinine, Ser 0.67 0.61 - 1.24 mg/dL   GFR, Estimated >40>60 >98>60 mL/min     PHYSICAL EXAM:   Gen: Sitting up in bed, NAD, appears well, wife at bedside  Lungs: Clear anterior fields Cardiac: Regular rate and rhythm Abd: Soft, nontender, nondistended, + bowel sounds: Ext:       Right lower extremity             Hinged brace fitting well, unlocked             Dressing is clean, dry and intact                         Dressing removed                         All wounds look great                          Profound  ecchymosis to posterior leg, stable              Mild swelling              Extremity is warm             + DP pulse             Distal motor and sensory functions intact             No DCT             compartments are soft, no pain out of proportion with passive stretching  Left lower extremity             Brace fitting appropriately and is unlocked             Distal motor and sensory function intact             Compartments are soft.  No pain out of proportion with passive stretching             + DP pulse             extremity is warm.  Good perfusion distally.  No other acute findings noted  Assessment/Plan: 3 Days Post-Op   Principal Problem:   Closed fracture of right tibial plateau, initial encounter Active Problems:   New ACL tear, left, initial encounter   Acute lateral meniscus tear of left knee   MCL sprain of left knee   Injury of posterolateral corner of knee, left, initial encounter   Anti-infectives (From admission, onward)   Start     Dose/Rate Route Frequency Ordered Stop   04/26/20 1113  vancomycin (VANCOCIN) powder  Status:  Discontinued          As needed 04/26/20 1114 04/26/20 1151   04/26/20 0845  ceFAZolin (ANCEF) 3 g in dextrose 5 % 50 mL IVPB        3 g 100 mL/hr over 30 Minutes Intravenous  Once 04/26/20 0830 04/26/20 0930   04/23/20 0600  ceFAZolin (ANCEF) 3 g in dextrose 5 % 50 mL IVPB        3 g 100 mL/hr over 30 Minutes Intravenous On call to O.R. 04/22/20 1604 04/22/20 1719   04/23/20 0600  ceFAZolin (ANCEF) 3 g in dextrose 5 % 50 mL IVPB  Status:  Discontinued        3 g 100 mL/hr over 30 Minutes Intravenous On call to O.R. 04/22/20 1957 04/22/20 2014    .  POD/HD#: 25  30 year old male fall from roof approximately 15 feet with bilateral tibial plateau fractures   -Work-related injury, fall from roof   -Complex right tibial plateau fracture, shear pattern s/p ORIF             Nonweightbearing 8 weeks             Unrestricted  range of motion right knee, hinged knee brace             Dressing change today                         TED hose             PT and OT evaluations             Ice and elevate             Aggressive toe and ankle motion   PT- please teach HEP for right knee ROM- AROM, PROM. Prone exercises as well. No ROM restrictions.  Quad sets, SLR, LAQ, SAQ, heel slides, stretching, prone flexion and extension   Ankle theraband program, heel cord stretching, toe towel curls, etc   No pillows under bend of knee when at rest, ok to place under heel to help work on extension. Can also use zero knee bone foam if available   Hinged knee brace on at all times, unlocked.  Ok to work on [employer] without brace with therapist supervision.  Brace back on after ROM session if removed   -Internal derangement left knee  MRI notable for pretty significant injury including complete ACL tear, lateral meniscus tear, posterior lateral corner injury and MCL sprain             Do think he can still weight-bear as tolerated in the brace for transfers.  Wheelchair for long distances              This can be followed up on a delayed basis.  He would likely benefit from repair of his ACL and PLC at the same time versus delayed ACL repair after [facility] is repaired               PT and OT can also work on knee range of motion for the left knee but in the hinged knee brace.  Continue focusing on quad strengthening and hamstring strengthening   - Pain management:             Multimodal   - ABL anemia/Hemodynamics             Blood pressures stable but suspect he may have some underlying undiagnosed hypertension    - Medical issues             History of lobectomy but denies any current pulmonary problems   - DVT/PE prophylaxis:            Eliquis x 4 weeks   - ID:              Perioperative antibiotics completed   - Metabolic Bone Disease:           Vitamin D deficiency----> supplement   - Activity:              Nonweightbearing right leg             Weight-bear as tolerated left leg for transfers             will likely need wheelchair for long distances             PT and OT   - FEN/GI prophylaxis/Foley/Lines:             Regular diet             dc IV   - Dispo:             dc today   Will need follow up in 10-14 days   I can only write for 7 days of pain meds per state law.  Instructed pt to call us when he runs out and we can refill if he has not had his post op appointment               Will need follow-up with Ortho trauma close to home in [facility].  In addition to Ortho trauma follow-up will likely need to be evaluated by sports medicine surgeon regarding his left knee unless if this constellation of injury is within the scope of practice for Dr.Mamczak.    Name:                          William Hamburger, DO Office Address:           [facility]  [address]                                                 [address]                                     [place], [place] [address] Office:  [phone]    Mearl Latin, PA-C [phone] (C) 04/29/2020, 11:55 AM  [facility] [address] [place] [address] [phone] [phone] (F)    After 5pm and on the weekends please log on to Amion, go to orthopaedics and the look under the [facility] Call for the provider(s) on call. You can also call our office at [phone] and then follow the prompts to be connected to the call team.

## 2020-05-02 ENCOUNTER — Encounter (HOSPITAL_COMMUNITY): Payer: Self-pay | Admitting: Orthopedic Surgery

## 2020-05-04 ENCOUNTER — Encounter (HOSPITAL_COMMUNITY): Payer: Self-pay | Admitting: Orthopedic Surgery

## 2021-11-14 IMAGING — CT CT 3D ACQUISTION WKST
1 of 2 series · 1 of 12 positions shown · non-contrast
Comparison: Radiographs and CT 04/22/2020.

CLINICAL DATA: Right tibial plateau fracture status post external
fixation.

EXAM:
CT OF THE RIGHT KNEE WITHOUT CONTRAST
3-DIMENSIONAL CT IMAGE RENDERING ON ACQUISITION WORKSTATION
TECHNIQUE: Multidetector CT imaging of the right knee was performed according
to the standard protocol. Multiplanar CT image reconstructions were
also generated. 3-dimensional CT images were rendered by
post-processing of the original CT data on an acquisition
workstation. The 3-dimensional CT images were interpreted and
findings were reported in the accompanying complete CT report for
this study.

[Series 602: <vrt range> · 1 of 6 slices shown]
[im 4/6]
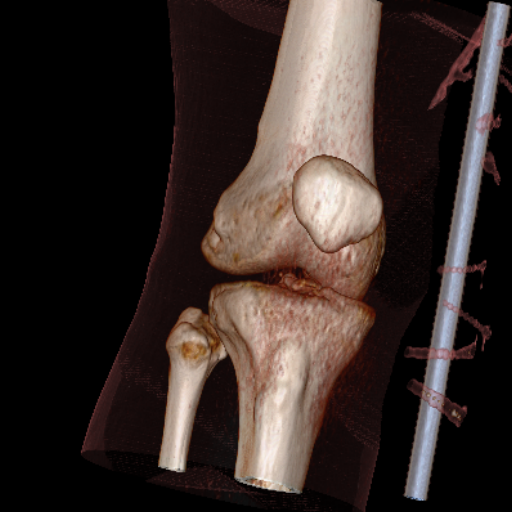

[1 of 12 positions shown; findings below may reference images not displayed]

FINDINGS: Bones/Joint/Cartilage

Interval external fixation. The external fixators are not imaged.
There is been no significant change in the alignment of the
comminuted fracture of the medial tibial plateau posteriorly. The
major fracture fragment posteriorly remains posteriorly displaced up
to 4.5 cm and demonstrates anterior angulation. The articular
surface of the medial tibial plateau is depressed by 1 cm. There is
central extension of the fracture with significant comminution of
the tibial spine. Fracture extends to involve the posterior aspect
of the lateral tibial plateau which remains mildly displaced.

The proximal fibula, distal femur and patella are intact. Moderate
sized lipohemarthrosis.

Ligaments

Suboptimally assessed by CT. The PCL inserts on the dominant
posterior fracture fragment of the proximal tibia.

Muscles and Tendons

Unremarkable.  The extensor mechanism is intact.

Soft tissues

Increased soft tissue swelling with subcutaneous edema anteriorly
and medially around the knee. No focal fluid collection, foreign
body or soft tissue emphysema.
IMPRESSION: 1. No significant change in alignment of the comminuted
intra-articular fracture of the proximal tibia. The medial tibial
plateau remains disrupted and significantly displaced posteriorly.
2. Moderate sized lipohemarthrosis.
3. Increased soft tissue swelling with subcutaneous edema anteriorly
and medially around the knee.

## 2021-11-20 IMAGING — DX DG OS CALCIS 2+V*R*
1 series · 2 of 2 positions shown · non-contrast
Comparison: None.

CLINICAL DATA: Prior trauma, fall.

EXAM:
RIGHT OS CALCIS - 2+ VIEW

[Series 1: calcaneus · 0.14mm/px · 2 of 2 slices shown]
[im 1/2]
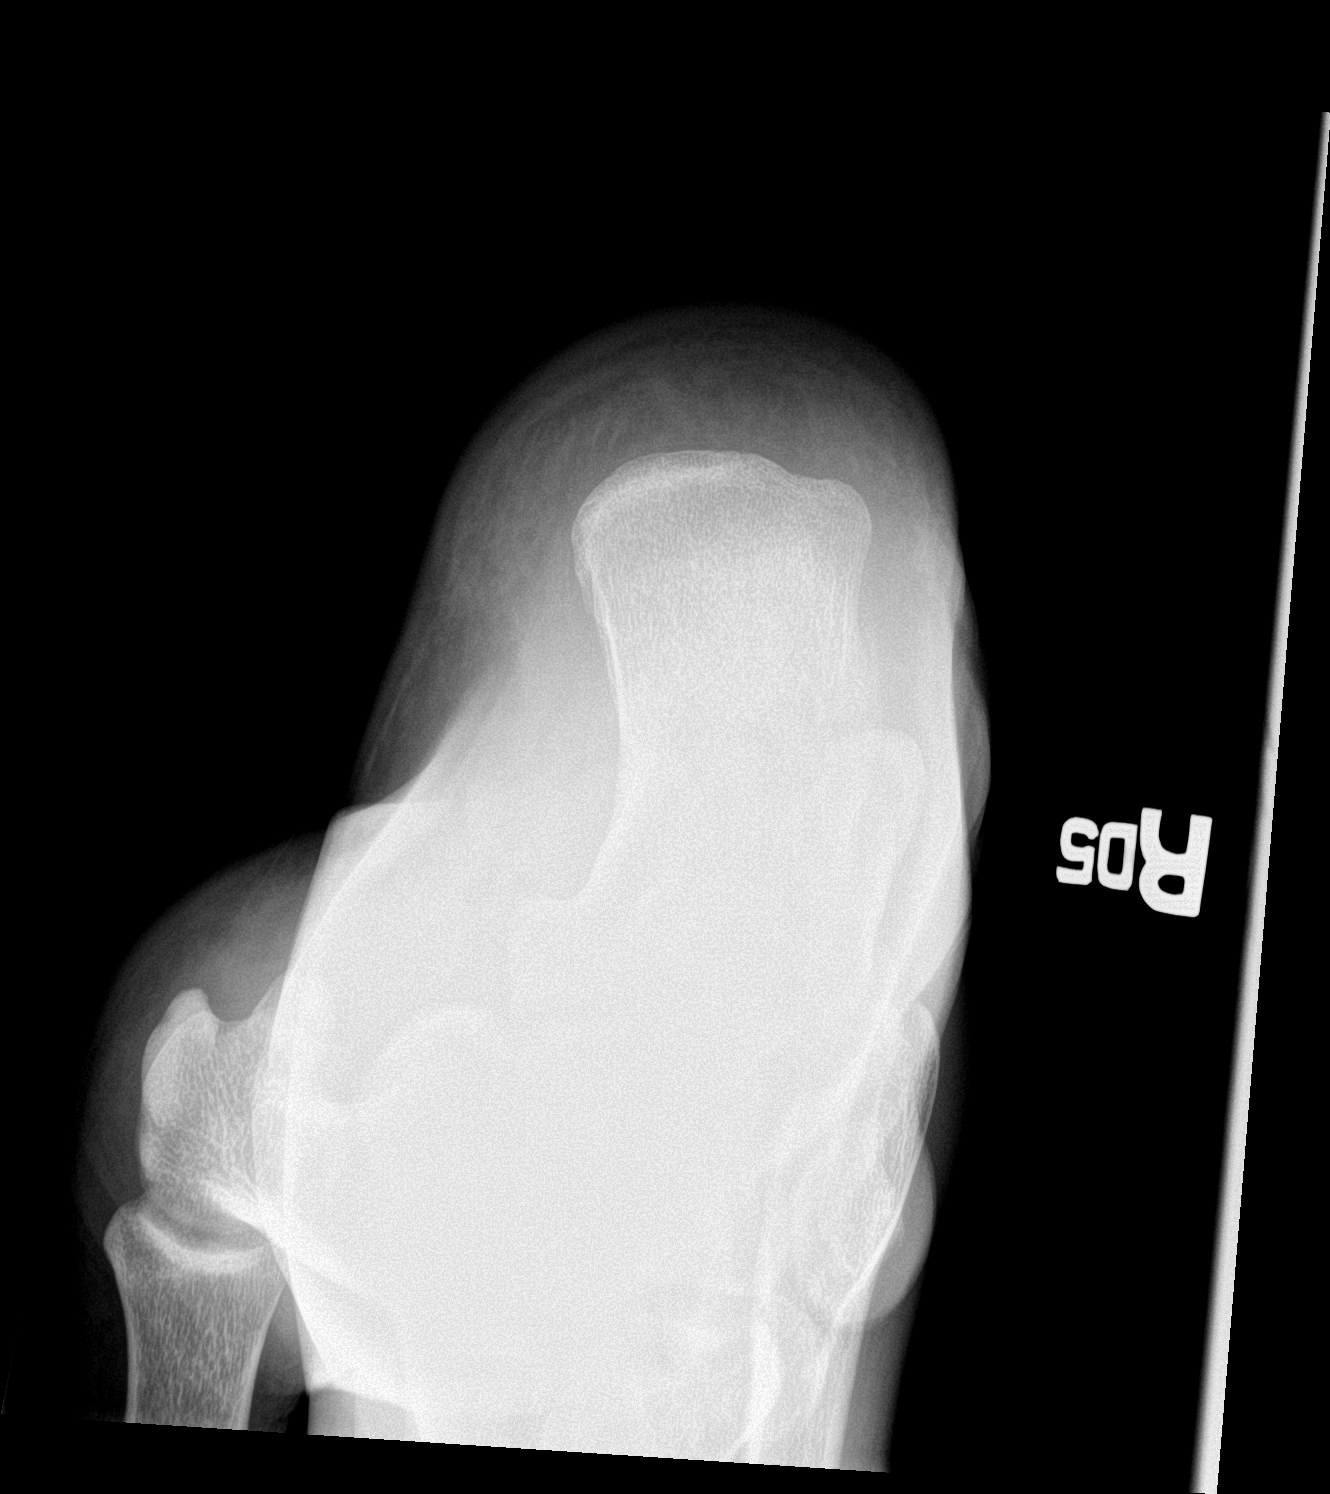
[im 2/2]
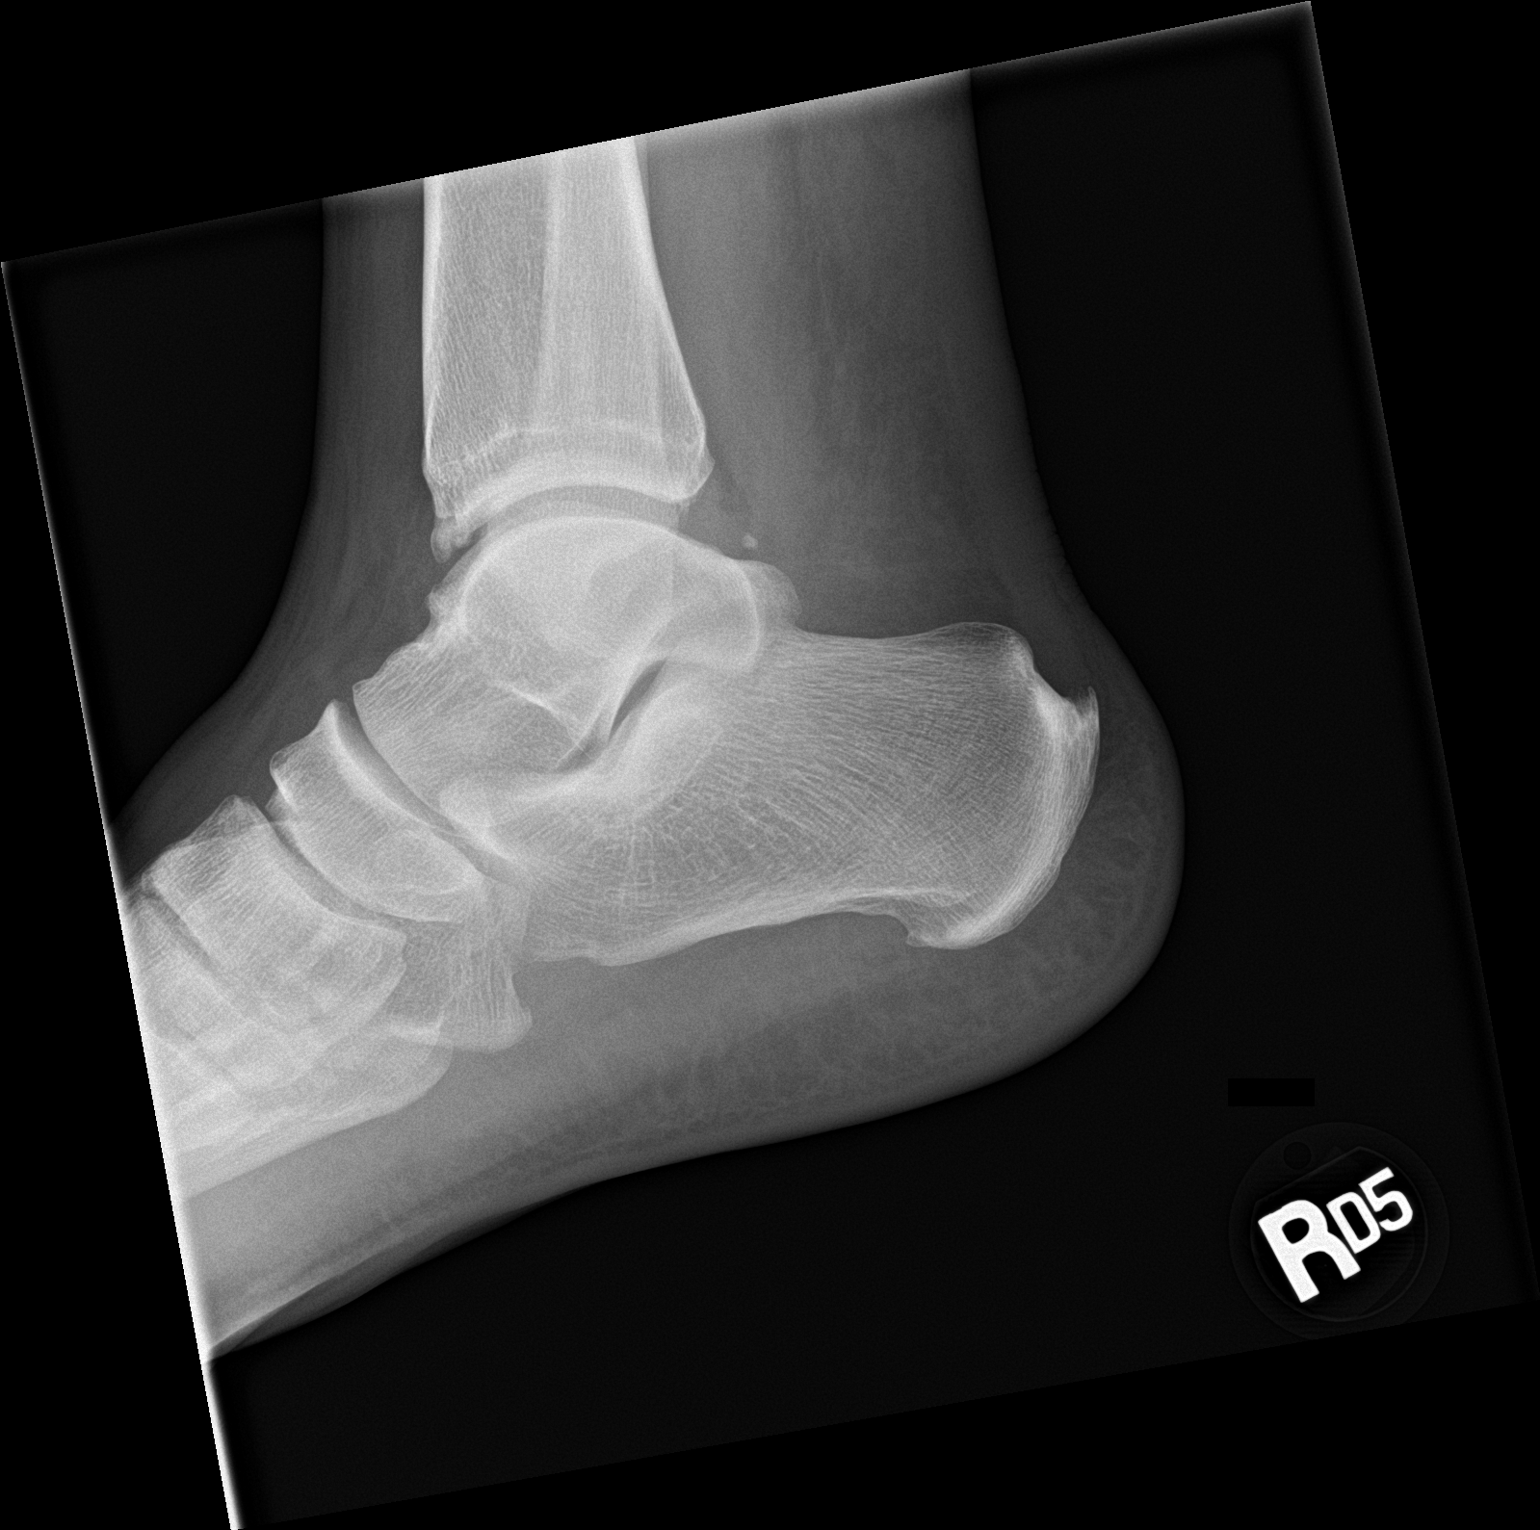

[2 of 2 positions shown; findings below may reference images not displayed]

FINDINGS: No fracture or focal osseous lesion. Small plantar and posterior
calcaneal enthesophytes. Anterior tibiotalar degenerative spurring.
Prominence of the calcaneal soft tissues.
IMPRESSION: No acute osseous abnormality.
